# Patient Record
Sex: Female | Born: 1960 | Race: White | Hispanic: No | Marital: Married | Smoking: Never smoker
Health system: Southern US, Community
[De-identification: ages and names within clinical notes are randomized; demographics above are authoritative.]

## PROBLEM LIST (undated history)

## (undated) DIAGNOSIS — K635 Polyp of colon: Secondary | ICD-10-CM

## (undated) DIAGNOSIS — E785 Hyperlipidemia, unspecified: Secondary | ICD-10-CM

## (undated) DIAGNOSIS — K648 Other hemorrhoids: Secondary | ICD-10-CM

## (undated) HISTORY — PX: TONSILLECTOMY: SUR1361

## (undated) HISTORY — DX: Hyperlipidemia, unspecified: E78.5

## (undated) HISTORY — DX: Polyp of colon: K63.5

## (undated) HISTORY — DX: Other hemorrhoids: K64.8

---

## 1998-04-10 ENCOUNTER — Other Ambulatory Visit: Admission: RE | Admit: 1998-04-10 | Discharge: 1998-04-10 | Payer: Self-pay | Admitting: Ophthalmology

## 1998-08-01 ENCOUNTER — Ambulatory Visit (HOSPITAL_COMMUNITY): Admission: RE | Admit: 1998-08-01 | Discharge: 1998-08-01 | Payer: Self-pay

## 1998-08-01 ENCOUNTER — Encounter: Payer: Self-pay | Admitting: Obstetrics and Gynecology

## 1999-04-16 ENCOUNTER — Other Ambulatory Visit: Admission: RE | Admit: 1999-04-16 | Discharge: 1999-04-16 | Payer: Self-pay | Admitting: Obstetrics and Gynecology

## 1999-08-12 ENCOUNTER — Ambulatory Visit (HOSPITAL_COMMUNITY): Admission: RE | Admit: 1999-08-12 | Discharge: 1999-08-12 | Payer: Self-pay | Admitting: Obstetrics and Gynecology

## 1999-08-12 ENCOUNTER — Encounter: Payer: Self-pay | Admitting: Obstetrics and Gynecology

## 2000-06-08 ENCOUNTER — Other Ambulatory Visit: Admission: RE | Admit: 2000-06-08 | Discharge: 2000-06-08 | Payer: Self-pay | Admitting: Obstetrics and Gynecology

## 2000-08-17 ENCOUNTER — Ambulatory Visit (HOSPITAL_COMMUNITY): Admission: RE | Admit: 2000-08-17 | Discharge: 2000-08-17 | Payer: Self-pay | Admitting: Obstetrics and Gynecology

## 2000-08-17 ENCOUNTER — Encounter: Payer: Self-pay | Admitting: Obstetrics and Gynecology

## 2001-07-08 ENCOUNTER — Other Ambulatory Visit: Admission: RE | Admit: 2001-07-08 | Discharge: 2001-07-08 | Payer: Self-pay | Admitting: Obstetrics and Gynecology

## 2001-08-23 ENCOUNTER — Encounter: Payer: Self-pay | Admitting: Obstetrics and Gynecology

## 2001-08-23 ENCOUNTER — Ambulatory Visit (HOSPITAL_COMMUNITY): Admission: RE | Admit: 2001-08-23 | Discharge: 2001-08-23 | Payer: Self-pay | Admitting: Obstetrics and Gynecology

## 2002-07-24 ENCOUNTER — Other Ambulatory Visit: Admission: RE | Admit: 2002-07-24 | Discharge: 2002-07-24 | Payer: Self-pay | Admitting: Obstetrics and Gynecology

## 2002-08-29 ENCOUNTER — Encounter: Payer: Self-pay | Admitting: Obstetrics and Gynecology

## 2002-08-29 ENCOUNTER — Ambulatory Visit (HOSPITAL_COMMUNITY): Admission: RE | Admit: 2002-08-29 | Discharge: 2002-08-29 | Payer: Self-pay | Admitting: Obstetrics and Gynecology

## 2003-07-26 ENCOUNTER — Other Ambulatory Visit: Admission: RE | Admit: 2003-07-26 | Discharge: 2003-07-26 | Payer: Self-pay | Admitting: Obstetrics and Gynecology

## 2003-08-31 ENCOUNTER — Ambulatory Visit (HOSPITAL_COMMUNITY): Admission: RE | Admit: 2003-08-31 | Discharge: 2003-08-31 | Payer: Self-pay | Admitting: Obstetrics and Gynecology

## 2003-11-05 ENCOUNTER — Ambulatory Visit (HOSPITAL_COMMUNITY): Admission: RE | Admit: 2003-11-05 | Discharge: 2003-11-05 | Payer: Self-pay | Admitting: Gastroenterology

## 2004-08-05 ENCOUNTER — Other Ambulatory Visit: Admission: RE | Admit: 2004-08-05 | Discharge: 2004-08-05 | Payer: Self-pay | Admitting: Obstetrics and Gynecology

## 2004-09-03 ENCOUNTER — Ambulatory Visit (HOSPITAL_COMMUNITY): Admission: RE | Admit: 2004-09-03 | Discharge: 2004-09-03 | Payer: Self-pay | Admitting: Obstetrics and Gynecology

## 2005-08-13 ENCOUNTER — Other Ambulatory Visit: Admission: RE | Admit: 2005-08-13 | Discharge: 2005-08-13 | Payer: Self-pay | Admitting: Obstetrics and Gynecology

## 2005-09-11 ENCOUNTER — Ambulatory Visit (HOSPITAL_COMMUNITY): Admission: RE | Admit: 2005-09-11 | Discharge: 2005-09-11 | Payer: Self-pay | Admitting: Obstetrics and Gynecology

## 2006-09-13 ENCOUNTER — Ambulatory Visit (HOSPITAL_COMMUNITY): Admission: RE | Admit: 2006-09-13 | Discharge: 2006-09-13 | Payer: Self-pay | Admitting: Obstetrics and Gynecology

## 2007-09-16 ENCOUNTER — Ambulatory Visit (HOSPITAL_COMMUNITY): Admission: RE | Admit: 2007-09-16 | Discharge: 2007-09-16 | Payer: Self-pay | Admitting: Obstetrics and Gynecology

## 2008-07-24 ENCOUNTER — Ambulatory Visit: Payer: Self-pay | Admitting: Vascular Surgery

## 2008-09-18 ENCOUNTER — Ambulatory Visit (HOSPITAL_COMMUNITY): Admission: RE | Admit: 2008-09-18 | Discharge: 2008-09-18 | Payer: Self-pay | Admitting: Obstetrics and Gynecology

## 2008-10-11 ENCOUNTER — Ambulatory Visit: Payer: Self-pay | Admitting: Vascular Surgery

## 2008-10-18 ENCOUNTER — Ambulatory Visit: Payer: Self-pay | Admitting: Vascular Surgery

## 2009-09-19 ENCOUNTER — Ambulatory Visit (HOSPITAL_COMMUNITY): Admission: RE | Admit: 2009-09-19 | Discharge: 2009-09-19 | Payer: Self-pay | Admitting: Obstetrics and Gynecology

## 2010-09-25 ENCOUNTER — Ambulatory Visit (HOSPITAL_COMMUNITY)
Admission: RE | Admit: 2010-09-25 | Discharge: 2010-09-25 | Payer: Self-pay | Source: Home / Self Care | Attending: Obstetrics and Gynecology | Admitting: Obstetrics and Gynecology

## 2010-11-13 DIAGNOSIS — E782 Mixed hyperlipidemia: Secondary | ICD-10-CM | POA: Insufficient documentation

## 2011-01-27 NOTE — Consult Note (Signed)
VASCULAR SURGERY CONSULTATION   Alexandra Burton, Alexandra Burton A  DOB:  February 26, 1961                                       07/24/2008  UEAVW#:[id number]   The patient is referred for vascular surgery consultation by Dr.  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  This 50-year-  old healthy female has noticed increasingly prominent varicosities in  her left leg both in the pretibial region and laterally over the last  few years.  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  She has had  no history of swelling, bleeding, ulceration, thrombophlebitis, deep  vein thrombosis, or other complications.  She states the discomfort is  present both while lying and standing, and is not necessarily relieved  by elevation or pain medication.  She has not had these previously  evaluated.   PAST MEDICAL HISTORY:  Negative for diabetes, hypertension, coronary  artery disease, COPD, or stroke.  She does have hyperlipidemia, treated  medically.   PREVIOUS SURGERIES:  Includes tonsillectomy.   FAMILY HISTORY:  Positive for varicose veins in her mother, diabetes and  COPD in her father, negative for stroke or coronary artery disease.   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  She does not use tobacco or  alcohol.   REVIEW OF SYSTEMS:  Unremarkable.   ALLERGIES:  None known.   MEDICATIONS:  Please see health history exam.   PHYSICAL EXAMINATION:  Blood pressure 114/67, heart rate is 54,  respirations 12.  Generally, she is a healthy-appearing middle-aged  female in no apparent distress.  She is alert and oriented x3.  Neck is  supple with 3+ carotid pulses palpable.  No bruits are audible.  Neurologic exam is normal.  No palpable adenopathy in the neck.  Chest  is clear to auscultation.  Cardiovascular exam is a regular rate and  rhythm without murmurs.  Abdomen is soft and nontender with no masses.  She has 3+ femoral, popliteal, and dorsalis pedis pulses bilaterally.  There is no distal edema or evidence of hyperpigmentation, ulceration,  or severe venous insufficiency distally in either leg.  The right leg is  free of varicosities.  The left leg has a prominent varix or large  reticular vein in the pretibial region and a string of prominent small  varicosities beginning at the knee level laterally extending down toward  the lateral malleolus, as well as some prominent veins at the medial  malleolus.  There are no thigh varicosities noted.   Venous duplex exam reveals no evidence of deep vein thrombosis and the  deep system is normal.  There is no valvular incompetence in the great  or small saphenous systems.   I discussed these findings with her and have recommended primary  sclerotherapy for these prominent and symptomatic varicosities, and we  will discuss this further with her and schedule her in the near future  if she so desires.   Quita Skye Hart [place], M.D.  Electronically Signed  JDL/MEDQ  D:  07/24/2008  T:  07/25/2008  Job:  [id number]

## 2011-01-27 NOTE — Procedures (Signed)
LOWER EXTREMITY VENOUS REFLUX EXAM   INDICATION:  Left lower extremity varicose vein.   EXAM:  Using color-flow imaging and pulse Doppler spectral analysis, the  left common femoral, superficial femoral, popliteal, posterior tibial,  greater and lesser saphenous veins are evaluated.  There is no evidence  suggesting deep venous insufficiency in the left lower extremity.   The left saphenofemoral junction is competent.  The left GSV is  competent.   The left proximal short saphenous vein demonstrates competency.   GSV Diameter (used if found to be incompetent only)                                            Right    Left  Proximal Greater Saphenous Vein           cm       cm  Proximal-to-mid-thigh                     cm       cm  Mid thigh                                 cm       cm  Mid-distal thigh                          cm       cm  Distal thigh                              cm       cm  Knee                                      cm       cm   IMPRESSION:  1. Left greater saphenous vein shows no evidence of reflux.  2. The left greater saphenous vein is not aneurysmal.  3. The left greater saphenous vein is not tortuous.  4. The deep venous system is competent.  5. The left lesser saphenous vein is competent.  6. A short varicose vein was noted on the anterior left calf which was      unable to have origin visualized.   ___________________________________________  Quita Skye Hart [place], M.D.   AS/MEDQ  D:  07/24/2008  T:  07/24/2008  Job:  [id number]

## 2011-01-30 NOTE — Op Note (Signed)
Alexandra Burton, Alexandra Burton                        ACCOUNT NO.:  [id number]   MEDICAL RECORD NO.:  [id number]                   PATIENT TYPE:  AMB   LOCATION:  ENDO                                 FACILITY:  [facility]   PHYSICIAN:  Anselmo Rod, M.D.               DATE OF BIRTH:  Feb 02, 1961   DATE OF PROCEDURE:  11/05/2003  DATE OF DISCHARGE:                                 OPERATIVE REPORT   PROCEDURE PERFORMED:  Screening colonoscopy.   ENDOSCOPIST:  Anselmo Rod, M.D.   INSTRUMENT USED:  Olympus videocolonoscope.   INDICATION FOR THE PROCEDURE:  A 50 year old with history of guaiac-positive  stool on routine physical, rule out colonic polyps, masses, etc.   PREPROCEDURE PREPARATION:  Informed consent was procured from the patient.  The patient was fasted for eight hours prior to the procedure and prepped  with a bottle of magnesium citrate and a gallon of GoLYTELY the night prior  to the procedure.   PREPROCEDURE PHYSICAL:  VITAL SIGNS:  Stable.  NECK:  Supple.  CHEST:  Clear to auscultation.  S1 and S2 regular.  ABDOMEN:  Soft with normal bowel sounds.   DESCRIPTION OF THE PROCEDURE:  The patient was placed in the left lateral  decubitus position and sedated with 75 mg of Demerol and 7.5 mg of Versed  intravenously.  Once the patient was adequately sedated and maintained on  low-flow oxygen and continuous cardiac monitoring, the Olympus  videocolonoscope was advanced from the rectum to the cecum and terminal  ileum.  The appendiceal orifice and ileocecal valve were clearly visualized  and photographed.  No masses, polyps, erosions, ulcerations, or diverticula  were seen.  Small internal hemorrhoids were seen on retroflexion in the  rectum.  The patient tolerated the procedure well without complications.  The terminal ileum appeared normal and without lesions.   IMPRESSION:  Normal colonoscopy to the terminal ileum except for small  internal hemorrhoids.    RECOMMENDATIONS:  1. Repeat colorectal cancer screening is recommended in the next 10 years     unless the patient develops any     abnormal symptoms in the interim.  2. Outpatient followup in the next two weeks for repeat guaiac testing.     Further recommendations will be made at that time.                                               Anselmo Rod, M.D.    JNM/MEDQ  D:  11/05/2003  T:  11/05/2003  Job:  [id number]   cc:   Talmadge Coventry, M.D.  [address]  [place]  [place] [address]  Fax: [phone]

## 2011-08-31 ENCOUNTER — Other Ambulatory Visit (HOSPITAL_COMMUNITY): Payer: Self-pay | Admitting: Obstetrics and Gynecology

## 2011-08-31 DIAGNOSIS — Z1231 Encounter for screening mammogram for malignant neoplasm of breast: Secondary | ICD-10-CM

## 2011-10-08 ENCOUNTER — Ambulatory Visit (HOSPITAL_COMMUNITY)
Admission: RE | Admit: 2011-10-08 | Discharge: 2011-10-08 | Disposition: A | Discharge status: Home / Self Care | Payer: BC Managed Care – PPO | Source: Ambulatory Visit | Attending: Obstetrics and Gynecology | Admitting: Obstetrics and Gynecology

## 2011-10-08 DIAGNOSIS — Z1231 Encounter for screening mammogram for malignant neoplasm of breast: Secondary | ICD-10-CM

## 2011-10-12 ENCOUNTER — Ambulatory Visit (HOSPITAL_COMMUNITY): Payer: Self-pay

## 2012-01-28 ENCOUNTER — Other Ambulatory Visit: Payer: Self-pay | Admitting: Family Medicine

## 2012-01-28 DIAGNOSIS — N6459 Other signs and symptoms in breast: Secondary | ICD-10-CM

## 2012-02-05 ENCOUNTER — Ambulatory Visit
Admission: RE | Admit: 2012-02-05 | Discharge: 2012-02-05 | Disposition: A | Discharge status: Home / Self Care | Payer: BC Managed Care – PPO | Source: Ambulatory Visit | Attending: Family Medicine | Admitting: Family Medicine

## 2012-02-05 DIAGNOSIS — N6459 Other signs and symptoms in breast: Secondary | ICD-10-CM

## 2012-09-15 ENCOUNTER — Other Ambulatory Visit (HOSPITAL_COMMUNITY): Payer: Self-pay | Admitting: Obstetrics and Gynecology

## 2012-09-15 DIAGNOSIS — Z1231 Encounter for screening mammogram for malignant neoplasm of breast: Secondary | ICD-10-CM

## 2012-10-10 ENCOUNTER — Ambulatory Visit (HOSPITAL_COMMUNITY)
Admission: RE | Admit: 2012-10-10 | Discharge: 2012-10-10 | Disposition: A | Discharge status: Home / Self Care | Payer: BC Managed Care – PPO | Source: Ambulatory Visit | Attending: Obstetrics and Gynecology | Admitting: Obstetrics and Gynecology

## 2012-10-10 DIAGNOSIS — Z1231 Encounter for screening mammogram for malignant neoplasm of breast: Secondary | ICD-10-CM | POA: Insufficient documentation

## 2012-10-12 ENCOUNTER — Other Ambulatory Visit: Payer: Self-pay | Admitting: Obstetrics and Gynecology

## 2012-10-12 DIAGNOSIS — R928 Other abnormal and inconclusive findings on diagnostic imaging of breast: Secondary | ICD-10-CM

## 2012-10-24 ENCOUNTER — Ambulatory Visit
Admission: RE | Admit: 2012-10-24 | Discharge: 2012-10-24 | Disposition: A | Discharge status: Home / Self Care | Payer: BC Managed Care – PPO | Source: Ambulatory Visit | Attending: Obstetrics and Gynecology | Admitting: Obstetrics and Gynecology

## 2012-10-24 DIAGNOSIS — R928 Other abnormal and inconclusive findings on diagnostic imaging of breast: Secondary | ICD-10-CM

## 2013-09-25 ENCOUNTER — Other Ambulatory Visit (HOSPITAL_COMMUNITY): Payer: Self-pay | Admitting: Obstetrics and Gynecology

## 2013-09-25 DIAGNOSIS — Z1231 Encounter for screening mammogram for malignant neoplasm of breast: Secondary | ICD-10-CM

## 2013-10-23 ENCOUNTER — Ambulatory Visit (HOSPITAL_COMMUNITY)
Admission: RE | Admit: 2013-10-23 | Discharge: 2013-10-23 | Disposition: A | Discharge status: Home / Self Care | Payer: BC Managed Care – PPO | Source: Ambulatory Visit | Attending: Obstetrics and Gynecology | Admitting: Obstetrics and Gynecology

## 2013-10-23 DIAGNOSIS — Z1231 Encounter for screening mammogram for malignant neoplasm of breast: Secondary | ICD-10-CM

## 2013-12-20 DIAGNOSIS — N952 Postmenopausal atrophic vaginitis: Secondary | ICD-10-CM | POA: Insufficient documentation

## 2013-12-20 DIAGNOSIS — N951 Menopausal and female climacteric states: Secondary | ICD-10-CM | POA: Insufficient documentation

## 2014-01-27 DIAGNOSIS — Z8379 Family history of other diseases of the digestive system: Secondary | ICD-10-CM | POA: Insufficient documentation

## 2014-04-20 DIAGNOSIS — K635 Polyp of colon: Secondary | ICD-10-CM

## 2014-04-20 HISTORY — DX: Polyp of colon: K63.5

## 2014-04-20 LAB — HM COLONOSCOPY

## 2014-09-21 ENCOUNTER — Other Ambulatory Visit (HOSPITAL_COMMUNITY): Payer: Self-pay | Admitting: Family Medicine

## 2014-09-21 DIAGNOSIS — Z1231 Encounter for screening mammogram for malignant neoplasm of breast: Secondary | ICD-10-CM

## 2014-11-15 ENCOUNTER — Ambulatory Visit (HOSPITAL_COMMUNITY)
Admission: RE | Admit: 2014-11-15 | Discharge: 2014-11-15 | Disposition: A | Discharge status: Home / Self Care | Payer: BC Managed Care – PPO | Source: Ambulatory Visit | Attending: Family Medicine | Admitting: Family Medicine

## 2014-11-15 DIAGNOSIS — Z1231 Encounter for screening mammogram for malignant neoplasm of breast: Secondary | ICD-10-CM | POA: Diagnosis present

## 2015-10-18 ENCOUNTER — Other Ambulatory Visit: Payer: Self-pay

## 2015-10-18 DIAGNOSIS — Z1231 Encounter for screening mammogram for malignant neoplasm of breast: Secondary | ICD-10-CM

## 2015-11-21 ENCOUNTER — Ambulatory Visit
Admission: RE | Admit: 2015-11-21 | Discharge: 2015-11-21 | Disposition: A | Discharge status: Home / Self Care | Payer: BC Managed Care – PPO | Source: Ambulatory Visit

## 2015-11-21 DIAGNOSIS — Z1231 Encounter for screening mammogram for malignant neoplasm of breast: Secondary | ICD-10-CM

## 2016-10-22 ENCOUNTER — Other Ambulatory Visit: Payer: Self-pay | Admitting: Family Medicine

## 2016-10-22 DIAGNOSIS — Z1231 Encounter for screening mammogram for malignant neoplasm of breast: Secondary | ICD-10-CM

## 2016-11-23 ENCOUNTER — Ambulatory Visit: Payer: BC Managed Care – PPO

## 2016-12-14 ENCOUNTER — Ambulatory Visit
Admission: RE | Admit: 2016-12-14 | Discharge: 2016-12-14 | Disposition: A | Discharge status: Home / Self Care | Payer: BC Managed Care – PPO | Source: Ambulatory Visit | Attending: Family Medicine | Admitting: Family Medicine

## 2016-12-14 DIAGNOSIS — Z1231 Encounter for screening mammogram for malignant neoplasm of breast: Secondary | ICD-10-CM

## 2016-12-15 ENCOUNTER — Ambulatory Visit: Payer: BC Managed Care – PPO

## 2016-12-17 LAB — HM MAMMOGRAPHY

## 2016-12-31 LAB — BASIC METABOLIC PANEL: Creatinine: 0.9 (ref <=1.1)

## 2016-12-31 LAB — LIPID PANEL
CHOLESTEROL: 219 — AB (ref 0–200)
HDL: 51 (ref 35–70)
LDL CALC: 146
Triglycerides: 112 (ref 40–160)

## 2016-12-31 LAB — HM PAP SMEAR: HM Pap smear: NEGATIVE

## 2017-11-25 ENCOUNTER — Other Ambulatory Visit: Payer: Self-pay | Admitting: Family Medicine

## 2017-11-25 DIAGNOSIS — Z139 Encounter for screening, unspecified: Secondary | ICD-10-CM

## 2017-12-15 ENCOUNTER — Ambulatory Visit
Admission: RE | Admit: 2017-12-15 | Discharge: 2017-12-15 | Disposition: A | Discharge status: Home / Self Care | Payer: BC Managed Care – PPO | Source: Ambulatory Visit | Attending: Family Medicine | Admitting: Family Medicine

## 2017-12-15 DIAGNOSIS — Z139 Encounter for screening, unspecified: Secondary | ICD-10-CM

## 2017-12-30 ENCOUNTER — Encounter: Payer: Self-pay | Admitting: Family Medicine

## 2018-01-03 ENCOUNTER — Ambulatory Visit: Payer: BC Managed Care – PPO | Admitting: Family Medicine

## 2018-01-03 ENCOUNTER — Other Ambulatory Visit: Payer: Self-pay

## 2018-01-03 ENCOUNTER — Encounter: Payer: Self-pay | Admitting: Family Medicine

## 2018-01-03 VITALS — BP 104/70 | HR 52 | Temp 97.7°F | Ht 66.0 in | Wt 170.6 lb

## 2018-01-03 DIAGNOSIS — Z0000 Encounter for general adult medical examination without abnormal findings: Secondary | ICD-10-CM

## 2018-01-03 DIAGNOSIS — Z0184 Encounter for antibody response examination: Secondary | ICD-10-CM | POA: Diagnosis not present

## 2018-01-03 DIAGNOSIS — E782 Mixed hyperlipidemia: Secondary | ICD-10-CM

## 2018-01-03 DIAGNOSIS — N951 Menopausal and female climacteric states: Secondary | ICD-10-CM | POA: Diagnosis not present

## 2018-01-03 LAB — LIPID PANEL
CHOLESTEROL: 199 mg/dL (ref 0–200)
HDL: 63.6 mg/dL (ref >39.00)
LDL CALC: 122 mg/dL — AB (ref 0–99)
NonHDL: 135.68
Total CHOL/HDL Ratio: 3
Triglycerides: 68 mg/dL (ref 0.0–149.0)
VLDL: 13.6 mg/dL (ref 0.0–40.0)

## 2018-01-03 LAB — COMPREHENSIVE METABOLIC PANEL
ALBUMIN: 4.3 g/dL (ref 3.5–5.2)
ALK PHOS: 65 U/L (ref 39–117)
ALT: 13 U/L (ref 0–35)
AST: 20 U/L (ref 0–37)
BUN: 12 mg/dL (ref 6–23)
CALCIUM: 9.3 mg/dL (ref 8.4–10.5)
CHLORIDE: 104 meq/L (ref 96–112)
CO2: 26 mEq/L (ref 19–32)
CREATININE: 0.71 mg/dL (ref 0.40–1.20)
GFR: 90.26 mL/min (ref >60.00)
Glucose, Bld: 87 mg/dL (ref 70–99)
Potassium: 4.1 mEq/L (ref 3.5–5.1)
Sodium: 140 mEq/L (ref 135–145)
Total Bilirubin: 0.5 mg/dL (ref 0.2–1.2)
Total Protein: 6.8 g/dL (ref 6.0–8.3)

## 2018-01-03 LAB — CBC WITH DIFFERENTIAL/PLATELET
BASOS PCT: 0.9 % (ref 0.0–3.0)
Basophils Absolute: 0 10*3/uL (ref 0.0–0.1)
EOS ABS: 0.2 10*3/uL (ref 0.0–0.7)
Eosinophils Relative: 3.1 % (ref 0.0–5.0)
HEMATOCRIT: 39.8 % (ref 36.0–46.0)
HEMOGLOBIN: 13.4 g/dL (ref 12.0–15.0)
Lymphocytes Relative: 37 % (ref 12.0–46.0)
Lymphs Abs: 1.9 10*3/uL (ref 0.7–4.0)
MCHC: 33.6 g/dL (ref 30.0–36.0)
MCV: 88.1 fl (ref 78.0–100.0)
Monocytes Absolute: 0.5 10*3/uL (ref 0.1–1.0)
Monocytes Relative: 10 % (ref 3.0–12.0)
Neutro Abs: 2.5 10*3/uL (ref 1.4–7.7)
Neutrophils Relative %: 49 % (ref 43.0–77.0)
Platelets: 241 10*3/uL (ref 150.0–400.0)
RBC: 4.52 Mil/uL (ref 3.87–5.11)
RDW: 13.4 % (ref 11.5–15.5)
WBC: 5.1 10*3/uL (ref 4.0–10.5)

## 2018-01-03 MED ORDER — FLUOXETINE HCL 20 MG PO CAPS: 20.0000 mg | ORAL_CAPSULE | Freq: Every day | ORAL | 3 refills | Status: DC

## 2018-01-03 MED ORDER — LOVASTATIN 20 MG PO TABS: 20.0000 mg | ORAL_TABLET | Freq: Every day | ORAL | 3 refills | Status: DC

## 2018-01-03 NOTE — Progress Notes (Signed)
Subjective  Chief Complaint  Patient presents with  . Establish Care    Transfer from [place]  . Medication Refill    Lovastation & Prozac    HPI: Alexandra Burton is a 57 y.o. female who presents to [facility] [facility] today for a Female Wellness Visit. She is a former [facility] patient and is here to reestablish care with me today.    Wellness Visit: annual visit with health maintenance review and exam without Pap   Healthy 57 yo female w/o complaints. Due for f/u hyperlipidemia on statin (although borderline ascvd risk score in past - started by prior pcp and pt favors continuing it). No AEs. On prozac for menopausal sxs doing well. Still an occ hot flush. No problems.   Pt is a 1st grade teacher: would consider MMR booster if indicated.   Lifestyle: Body mass index is 27.54 kg/m. Wt Readings from Last 3 Encounters:  01/03/18 170 lb 9.6 oz (77.4 kg)   Diet: low fat Exercise: intermittently, walking  Patient Active Problem List   Diagnosis Date Noted  . Family history of liver disease February 15, 2014    Overview:  Mom died from non-alcoholic cirrhosis   . 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 Atrophic vaginitis 12/20/2013  . Mixed hyperlipidemia 11/13/2010   Health Maintenance  Topic Date Due  . Hepatitis C Screening  1961-06-15  . HIV Screening  04/03/1976  . INFLUENZA VACCINE  04/14/2018  . MAMMOGRAM  12/16/2018  . TETANUS/TDAP  12/23/2021  . PAP SMEAR  12/31/2021  . COLONOSCOPY  04/20/2024   Immunization History  Administered Date(s) Administered  . Influenza, Seasonal, Injecte, Preservative Fre 07/16/2015, 08/29/2016  . MMR 09/15/1987  . PPD Test 09/15/1987  . Td 06/06/2002, 09/14/2009  . Tdap 09/14/2009, 12/24/2011   We updated and reviewed the patient's past history in detail and it is documented below. Allergies: Patient has No Known Allergies. Past Medical History Patient  has a past  medical history of Colon polyp (04/20/2014), Hyperlipemia, and Internal hemorrhoids without complication. Past Surgical History Patient  has a past surgical history that includes Tonsillectomy. Family History: Patient family history includes Brain cancer in her maternal grandfather; Breast cancer in her sister; COPD in her father; Cirrhosis in her mother; Diabetes in her father; Heart disease in her maternal grandfather; Hyperlipidemia in her father. Social History:  Patient  reports that she has never smoked. She has never used smokeless tobacco. She reports that she drank alcohol. She reports that she does not use drugs.  Review of Systems: Constitutional: negative for fever or malaise Ophthalmic: negative for photophobia, double vision or loss of vision Cardiovascular: negative for chest pain, dyspnea on exertion, or new LE swelling Respiratory: negative for SOB or persistent cough Gastrointestinal: negative for abdominal pain, change in bowel habits or melena Genitourinary: negative for dysuria or gross hematuria, no abnormal uterine bleeding or disharge Musculoskeletal: negative for new gait disturbance or muscular weakness Integumentary: negative for new or persistent rashes, no breast lumps Neurological: negative for TIA or stroke symptoms Psychiatric: negative for SI or delusions Allergic/Immunologic: negative for hives  Patient Care Team    Relationship Specialty Notifications Start End  Leamon Arnt, MD PCP - General Family Medicine  12/14/16   Christain Sacramento, [place] Referring Physician Optometry  01/03/18   Jerelene Redden, DDS     01/03/18     Objective  Vitals: BP 104/70   Pulse (!) 52  Temp 97.7 F (36.5 C)   Ht _0  (1.676 m)   Wt 170 lb 9.6 oz (77.4 kg)   LMP 09/27/2012   BMI 27.54 kg/m  General:  Well developed, well nourished, no acute distress  Psych:  Alert and orientedx3,normal mood and affect HEENT:  Normocephalic, atraumatic, non-icteric sclera, PERRL,  oropharynx is clear without mass or exudate, supple neck without adenopathy, mass or thyromegaly Cardiovascular:  Normal S1, S2, RRR without gallop, rub or murmur, nondisplaced PMI Respiratory:  Good breath sounds bilaterally, CTAB with normal respiratory effort Gastrointestinal: normal bowel sounds, soft, non-tender, no noted masses. No HSM MSK: no deformities, contusions. Joints are without erythema or swelling. Spine and CVA region are nontender Skin:  Warm, no rashes or suspicious lesions noted Neurologic:    Mental status is normal. CN 2-11 are normal. Gross motor and sensory exams are normal. Normal gait. No tremor Breast Exam: No mass, skin retraction or nipple discharge is appreciated in either breast. No axillary adenopathy. Fibrocystic changes are not noted  Assessment  1. Annual physical exam   2. Mixed hyperlipidemia   3. Menopausal vasomotor syndrome   4. Immunity to measles determined by serologic test      Plan  Female Wellness Visit:  Age appropriate Health Maintenance and Prevention measures were discussed with patient. Included topics are cancer screening recommendations, ways to keep healthy (see AVS) including dietary and exercise recommendations, regular eye and dental care, use of seat belts, and avoidance of moderate alcohol use and tobacco use. Screens are up to date.   BMI: discussed patient's BMI and encouraged positive lifestyle modifications to help get to or maintain a target BMI.  HM needs and immunizations were addressed and ordered. See below for orders. See HM and immunization section for updates. Check Measles immunity and recommend booster if low due to reemergence of measles and her work exposure.   Routine labs and screening tests ordered including cmp, cbc and lipids where appropriate.  Discussed recommendations regarding Vit D and calcium supplementation (see AVS)  Recheck lipids; refilled statin and prozac. Stable.   Follow up: 12 months for cpe     Commons side effects, risks, benefits, and alternatives for medications and treatment plan prescribed today were discussed, and the patient expressed understanding of the given instructions. Patient is instructed to call or message via MyChart if he/she has any questions or concerns regarding our treatment plan. No barriers to understanding were identified. We discussed Red Flag symptoms and signs in detail. Patient expressed understanding regarding what to do in case of urgent or emergency type symptoms.   Medication list was reconciled, printed and provided to the patient in AVS. Patient instructions and summary information was reviewed with the patient as documented in the AVS. This note was prepared with assistance of Dragon voice recognition software. Occasional wrong-word or sound-a-like substitutions may have occurred due to the inherent limitations of voice recognition software  Orders Placed This Encounter  Procedures  . HM MAMMOGRAPHY  . HM PAP SMEAR  . Basic metabolic panel  . Lipid panel  . CBC with Differential/Platelet  . Comprehensive metabolic panel  . Lipid panel  . HIV antibody  . Hepatitis C antibody  . Rubeola antibody IgG  . HM COLONOSCOPY   Meds ordered this encounter  Medications  . FLUoxetine (PROZAC) 20 MG capsule    Sig: Take 1 capsule (20 mg total) by mouth daily.    Dispense:  90 capsule    Refill:  3  .  lovastatin (MEVACOR) 20 MG tablet    Sig: Take 1 tablet (20 mg total) by mouth at bedtime.    Dispense:  90 tablet    Refill:  3

## 2018-01-03 NOTE — Patient Instructions (Addendum)
It was so good seeing you again! Thank you for establishing with my new practice and allowing me to continue caring for you. It means a lot to me.   Please schedule a follow up appointment with me in 12 months for your annual complete physical; please come fasting.  Please do these things to maintain good health!   Exercise at least 30-45 minutes a day,  4-5 days a week.   Eat a low-fat diet with lots of fruits and vegetables, up to 7-9 servings per day.  Drink plenty of water daily. Try to drink 8 8oz glasses per day.  Seatbelts can save your life. Always wear your seatbelt.  Place Smoke Detectors on every level of your home and check batteries every year.  Schedule an appointment with an eye doctor for an eye exam every 1-2 years  Safe sex - use condoms to protect yourself from STDs if you could be exposed to these types of infections. Use birth control if you do not want to become pregnant and are sexually active.  Avoid heavy alcohol use. If you drink, keep it to less than 2 drinks/day and not every day.  [place].  Choose someone you trust that could speak for you if you became unable to speak for yourself.  Depression is common in our stressful world.If you're feeling down or losing interest in things you normally enjoy, please come in for a visit.  If anyone is threatening or hurting you, please get help. Physical or Emotional Violence is never OK.     Calcium Intake Recommendations You can take Caltrate Plus twice a day or get it through your diet or other OTC supplements (Viactiv, OsCal etc)  Calcium is a mineral that affects many functions in the body, including:  Blood clotting.  Blood vessel function.  Nerve impulse conduction.  Hormone secretion.  Muscle contraction.  Bone and teeth functions.  Most of your body's calcium supply is stored in your bones and teeth. When your calcium stores are low, you may be at risk for low bone mass,  bone loss, and bone fractures. Consuming enough calcium helps to grow healthy bones and teeth and to prevent breakdown over time. It is very important that you get enough calcium if you are:  A child undergoing rapid growth.  An adolescent girl.  A pre- or post-menopausal woman.  A woman whose menstrual cycle has stopped due to anorexia nervosa or regular intense exercise.  An individual with lactose intolerance or a milk allergy.  A vegetarian.  What is my plan? Try to consume the recommended amount of calcium daily based on your age. Depending on your overall health, your health care provider may recommend increased calcium intake.General daily calcium intake recommendations by age are:  Birth to 6 months: 200 mg.  Infants 7 to 12 months: 260 mg.  Children 1 to 3 years: 700 mg.  Children 4 to 8 years: 1,000 mg.  Children 9 to 13 years: 1,300 mg.  Teens 14 to 18 years: 1,300 mg.  Adults 19 to 50 years: 1,000 mg.  Adult women 51 to 70 years: 1,200 mg.  Adult men 51 to 70 years: 1,000 mg.  Adults 71 years and older: 1,200 mg.  Pregnant and breastfeeding teens: 1,300 mg.  Pregnant and breastfeeding adults: 1,000 mg.  What do I need to know about calcium intake?  In order for the body to absorb calcium, it needs vitamin D. You can get vitamin D  through (we recommend getting [phone] units of Vitamin D daily) ? Direct exposure of the skin to sunlight. ? Foods, such as egg yolks, liver, saltwater fish, and fortified milk. ? Supplements.  Consuming too much calcium may cause: ? Constipation. ? Decreased absorption of iron and zinc. ? Kidney stones.  Calcium supplements may interact with certain medicines. Check with your health care provider before starting any calcium supplements.  Try to get most of your calcium from food. What foods can I eat? Grains  Fortified oatmeal. Fortified ready-to-eat cereals. Fortified frozen waffles. Vegetables Turnip greens.  Broccoli. Fruits Fortified orange juice. Meats and Other Protein Sources Canned sardines with bones. Canned salmon with bones. Soy beans. Tofu. Baked beans. Almonds. [place] nuts. Sunflower seeds. Dairy Milk. Yogurt. Cheese. Cottage cheese. Beverages Fortified soy milk. Fortified rice milk. Sweets/Desserts Pudding. Ice Cream. Milkshakes. Blackstrap molasses. The items listed above may not be a complete list of recommended foods or beverages. Contact your dietitian for more options. What foods can affect my calcium intake? It may be more difficult for your body to use calcium or calcium may leave your body more quickly if you consume large amounts of:  Sodium.  Protein.  Caffeine.  Alcohol.  This information is not intended to replace advice given to you by your health care provider. Make sure you discuss any questions you have with your health care provider. Document Released: 04/14/2004 Document Revised: 03/20/2016 Document Reviewed: 02/06/2014 Elsevier Interactive Patient Education  2018 [employer].

## 2018-01-04 LAB — HIV ANTIBODY (ROUTINE TESTING W REFLEX): HIV 1&2 Ab, 4th Generation: NONREACTIVE

## 2018-01-04 LAB — RUBEOLA ANTIBODY IGG: RUBEOLA IGG: 102 [AU]/ml

## 2018-01-05 LAB — HEPATITIS C ANTIBODY
Hepatitis C Ab: NONREACTIVE
SIGNAL TO CUT-OFF: 0.01 (ref <1.00)

## 2018-11-22 ENCOUNTER — Other Ambulatory Visit: Payer: Self-pay | Admitting: Family Medicine

## 2018-11-22 DIAGNOSIS — Z1231 Encounter for screening mammogram for malignant neoplasm of breast: Secondary | ICD-10-CM

## 2019-01-04 ENCOUNTER — Other Ambulatory Visit: Payer: Self-pay | Admitting: Family Medicine

## 2019-01-04 MED ORDER — FLUOXETINE HCL 20 MG PO CAPS: 20.0000 mg | ORAL_CAPSULE | Freq: Every day | ORAL | 0 refills | Status: DC

## 2019-01-04 NOTE — Telephone Encounter (Signed)
See note

## 2019-01-04 NOTE — Telephone Encounter (Signed)
Pt is >3 months overdue pt given #7 tablet refill to last until appt Requested Prescriptions  Pending Prescriptions Disp Refills  . FLUoxetine (PROZAC) 20 MG capsule 7 capsule 0    Sig: Take 1 capsule (20 mg total) by mouth daily.     Psychiatry:  Antidepressants - SSRI Failed - 01/04/2019  1:00 PM      Failed - Valid encounter within last 6 months    Recent Outpatient Visits          1 year ago Annual physical exam   [facility], MD      Future Appointments            In 1 week Leamon Arnt, MD [place], [place]

## 2019-01-04 NOTE — Telephone Encounter (Signed)
Copied from [place] [phone]. Topic: Quick Communication - Rx Refill/Question >> Jan 04, 2019 12:59 PM Ahmed Prima L wrote: Medication: FLUoxetine (PROZAC) 20 MG capsule  Has the patient contacted their pharmacy? Yes- they keep sending to [facility]  (Agent: If no, request that the patient contact the pharmacy for the refill.) (Agent: If yes, when and what did the pharmacy advise?)  Preferred Pharmacy (with phone number or street name): [facility], [place] [address] Phone: [phone] Fax: [phone]   Agent: Please be advised that RX refills may take up to 3 business days. We ask that you follow-up with your pharmacy.

## 2019-01-05 ENCOUNTER — Other Ambulatory Visit: Payer: Self-pay | Admitting: Family Medicine

## 2019-01-05 DIAGNOSIS — Z1231 Encounter for screening mammogram for malignant neoplasm of breast: Secondary | ICD-10-CM

## 2019-01-07 ENCOUNTER — Other Ambulatory Visit: Payer: Self-pay | Admitting: Family Medicine

## 2019-01-11 ENCOUNTER — Encounter: Payer: Self-pay | Admitting: Family Medicine

## 2019-01-11 ENCOUNTER — Other Ambulatory Visit: Payer: Self-pay

## 2019-01-11 ENCOUNTER — Ambulatory Visit (INDEPENDENT_AMBULATORY_CARE_PROVIDER_SITE_OTHER): Payer: BC Managed Care – PPO | Admitting: Family Medicine

## 2019-01-11 VITALS — Resp 16 | Wt 162.8 lb

## 2019-01-11 DIAGNOSIS — N951 Menopausal and female climacteric states: Secondary | ICD-10-CM

## 2019-01-11 DIAGNOSIS — E782 Mixed hyperlipidemia: Secondary | ICD-10-CM | POA: Diagnosis not present

## 2019-01-11 MED ORDER — LOVASTATIN 20 MG PO TABS: 20.0000 mg | ORAL_TABLET | Freq: Every day | ORAL | 3 refills | Status: DC

## 2019-01-11 MED ORDER — FLUOXETINE HCL 20 MG PO CAPS: 20.0000 mg | ORAL_CAPSULE | Freq: Every day | ORAL | 3 refills | Status: DC

## 2019-01-11 NOTE — Assessment & Plan Note (Signed)
Mild hyperlipidemia, mixed: Well-controlled on statin.  Will return in July for fasting lipid panel and LFTs.  Refilled medications at current dose

## 2019-01-11 NOTE — Progress Notes (Signed)
Virtual Visit via Video Note  Subjective  CC:  Chief Complaint  Patient presents with  . Hyperlipidemia  . Menopausal vasomotor syndrome     I connected with [place] on 01/11/19 at  8:00 AM EDT by a video enabled telemedicine application and verified that I am speaking with the correct person using two identifiers. Location patient: Home Location provider: [employer], Office Persons participating in the virtual visit: [place], Leamon Arnt, MD Lilli Light, [place] discussed the limitations of evaluation and management by telemedicine and the availability of in person appointments. The patient expressed understanding and agreed to proceed. HPI: Alexandra Burton is a 58 y.o. female who was contacted today to address the problems listed above in the chief complaint. . 58 year old female who takes Prozac mainly for hot flashes.  She admits that also helps with decreasing worry.  She does have history of mood disorder.  Doing very well and needs refills.  Has rare hot flash. . Requesting refill for hyperlipidemia.  Last checked a year ago.  Has been stable on her statin.  Had mildly elevated cholesterol in the past.  No myalgias.  No concerns.  She was a healthy lifestyle with healthy diet and regular exercise. Marland Kitchen Health maintenance complete physical postponed till July. Assessment  1. Mixed hyperlipidemia   2. Menopausal vasomotor syndrome      Plan  See below for problem based assessment and plan documentation  I discussed the assessment and treatment plan with the patient. The patient was provided an opportunity to ask questions and all were answered. The patient agreed with the plan and demonstrated an understanding of the instructions.   The patient was advised to call back or seek an in-person evaluation if the symptoms worsen or if the condition fails to improve as anticipated. Follow up: Return for complete physical, as scheduled.   04/11/2019  Meds ordered this encounter  Medications  . lovastatin (MEVACOR) 20 MG tablet    Sig: Take 1 tablet (20 mg total) by mouth at bedtime.    Dispense:  90 tablet    Refill:  3  . FLUoxetine (PROZAC) 20 MG capsule    Sig: Take 1 capsule (20 mg total) by mouth daily.    Dispense:  90 capsule    Refill:  3      I reviewed the patients updated PMH, FH, and SocHx.    Patient Active Problem List   Diagnosis Date Noted  . Family history of liver disease 01/27/2014  . 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 Atrophic vaginitis 12/20/2013  . Mixed hyperlipidemia 11/13/2010   Current Meds  Medication Sig  . FLUoxetine (PROZAC) 20 MG capsule Take 1 capsule (20 mg total) by mouth daily.  Marland Kitchen lovastatin (MEVACOR) 20 MG tablet Take 1 tablet (20 mg total) by mouth at bedtime.  . [DISCONTINUED] FLUoxetine (PROZAC) 20 MG capsule Take 1 capsule (20 mg total) by mouth daily.  . [DISCONTINUED] lovastatin (MEVACOR) 20 MG tablet Take 1 tablet (20 mg total) by mouth at bedtime.    Allergies: Patient has No Known Allergies. Family History: Patient family history includes Brain cancer in her maternal grandfather; Breast cancer in her sister; COPD in her father; Cirrhosis in her mother; Diabetes in her father; Heart disease in her maternal grandfather; Hyperlipidemia in her father. Social History:  Patient  reports that she has never smoked. She has never used smokeless tobacco. She reports previous alcohol use. She  reports that she does not use drugs.  Review of Systems: Constitutional: Negative for fever malaise or anorexia Cardiovascular: negative for chest pain Respiratory: negative for SOB or persistent cough Gastrointestinal: negative for abdominal pain  OBJECTIVE Vitals: Resp 16   Wt 162 lb 12.8 oz (73.8 kg)   LMP 09/27/2012   BMI 26.28 kg/m  General: no acute distress , A&Ox3  Leamon Arnt, MD

## 2019-01-11 NOTE — Assessment & Plan Note (Signed)
Very stable on Prozac.  We will continue.  Will consider slow wean in the future.

## 2019-01-11 NOTE — Patient Instructions (Signed)
Please follow up as scheduled for your next visit with me: 04/11/2019 for complete physical  If you have any questions or concerns, please don't hesitate to send me a message via MyChart or call the office at [phone]. Thank you for visiting with [place] today! It's our pleasure caring for you.

## 2019-03-21 ENCOUNTER — Ambulatory Visit
Admission: RE | Admit: 2019-03-21 | Discharge: 2019-03-21 | Disposition: A | Discharge status: Home / Self Care | Payer: BC Managed Care – PPO | Source: Ambulatory Visit | Attending: Family Medicine | Admitting: Family Medicine

## 2019-03-21 ENCOUNTER — Other Ambulatory Visit: Payer: Self-pay

## 2019-03-21 DIAGNOSIS — Z1231 Encounter for screening mammogram for malignant neoplasm of breast: Secondary | ICD-10-CM

## 2019-04-11 ENCOUNTER — Other Ambulatory Visit: Payer: Self-pay

## 2019-04-11 ENCOUNTER — Encounter: Payer: Self-pay | Admitting: Family Medicine

## 2019-04-11 ENCOUNTER — Ambulatory Visit (INDEPENDENT_AMBULATORY_CARE_PROVIDER_SITE_OTHER): Payer: BC Managed Care – PPO | Admitting: Family Medicine

## 2019-04-11 VITALS — BP 116/78 | HR 59 | Temp 98.1°F | Resp 16 | Ht 66.0 in | Wt 163.4 lb

## 2019-04-11 DIAGNOSIS — N951 Menopausal and female climacteric states: Secondary | ICD-10-CM | POA: Diagnosis not present

## 2019-04-11 DIAGNOSIS — E782 Mixed hyperlipidemia: Secondary | ICD-10-CM

## 2019-04-11 DIAGNOSIS — Z0001 Encounter for general adult medical examination with abnormal findings: Secondary | ICD-10-CM

## 2019-04-11 DIAGNOSIS — Z8379 Family history of other diseases of the digestive system: Secondary | ICD-10-CM

## 2019-04-11 DIAGNOSIS — R55 Syncope and collapse: Secondary | ICD-10-CM | POA: Diagnosis not present

## 2019-04-11 DIAGNOSIS — Z0000 Encounter for general adult medical examination without abnormal findings: Secondary | ICD-10-CM

## 2019-04-11 LAB — CBC WITH DIFFERENTIAL/PLATELET
Basophils Absolute: 0 10*3/uL (ref 0.0–0.1)
Basophils Relative: 0.6 % (ref 0.0–3.0)
Eosinophils Absolute: 0.1 10*3/uL (ref 0.0–0.7)
Eosinophils Relative: 2.1 % (ref 0.0–5.0)
HCT: 42.9 % (ref 36.0–46.0)
Hemoglobin: 14 g/dL (ref 12.0–15.0)
Lymphocytes Relative: 33.4 % (ref 12.0–46.0)
Lymphs Abs: 1.8 10*3/uL (ref 0.7–4.0)
MCHC: 32.8 g/dL (ref 30.0–36.0)
MCV: 89.8 fl (ref 78.0–100.0)
Monocytes Absolute: 0.6 10*3/uL (ref 0.1–1.0)
Monocytes Relative: 10.1 % (ref 3.0–12.0)
Neutro Abs: 3 10*3/uL (ref 1.4–7.7)
Neutrophils Relative %: 53.8 % (ref 43.0–77.0)
Platelets: 239 10*3/uL (ref 150.0–400.0)
RBC: 4.77 Mil/uL (ref 3.87–5.11)
RDW: 13.2 % (ref 11.5–15.5)
WBC: 5.5 10*3/uL (ref 4.0–10.5)

## 2019-04-11 LAB — COMPREHENSIVE METABOLIC PANEL
ALT: 10 U/L (ref 0–35)
AST: 17 U/L (ref 0–37)
Albumin: 4.4 g/dL (ref 3.5–5.2)
Alkaline Phosphatase: 70 U/L (ref 39–117)
BUN: 10 mg/dL (ref 6–23)
CO2: 30 mEq/L (ref 19–32)
Calcium: 9.6 mg/dL (ref 8.4–10.5)
Chloride: 102 mEq/L (ref 96–112)
Creatinine, Ser: 0.84 mg/dL (ref 0.40–1.20)
GFR: 69.63 mL/min (ref >60.00)
Glucose, Bld: 87 mg/dL (ref 70–99)
Potassium: 5.3 mEq/L — ABNORMAL HIGH (ref 3.5–5.1)
Sodium: 137 mEq/L (ref 135–145)
Total Bilirubin: 0.5 mg/dL (ref 0.2–1.2)
Total Protein: 6.9 g/dL (ref 6.0–8.3)

## 2019-04-11 LAB — LIPID PANEL
Cholesterol: 208 mg/dL — ABNORMAL HIGH (ref 0–200)
HDL: 49.7 mg/dL (ref >39.00)
NonHDL: 158.29
Total CHOL/HDL Ratio: 4
Triglycerides: 209 mg/dL — ABNORMAL HIGH (ref 0.0–149.0)
VLDL: 41.8 mg/dL — ABNORMAL HIGH (ref 0.0–40.0)

## 2019-04-11 LAB — TSH: TSH: 1.98 u[IU]/mL (ref 0.35–4.50)

## 2019-04-11 LAB — LDL CHOLESTEROL, DIRECT: Direct LDL: 119 mg/dL

## 2019-04-11 NOTE — Progress Notes (Signed)
Subjective  Chief Complaint  Patient presents with  . Annual Exam    She is fasting  . Hyperlipidemia    HPI: Alexandra Burton is a 58 y.o. female who presents to [place] at [facility] today for a Female Wellness Visit. She also has the concerns and/or needs as listed above in the chief complaint. These will be addressed in addition to the Health Maintenance Visit.   Wellness Visit: annual visit with health maintenance review and exam without Pap   HM: Mammogram and Pap smear screening up-to-date.  Continues healthy lifestyle.  Walks 5 miles 2-3 times per week.  Does Pilates.  Eats healthy.  Weight is stable. Chronic disease f/u and/or acute problem visit: (deemed necessary to be done in addition to the wellness visit):  Menopausal symptoms are stable on Prozac.  Hyperlipidemia on statin without adverse effects.  Compliance is good with medications.  No myalgias.  Due for recheck with LFTs.  There is a family history of nonalcoholic cirrhosis in her mother.  Patient denies right upper quadrant pain, jaundice or edema.  Syncopal episode patient reports 2 days ago she was working out in a recumbent position doing Pilates and she went to stand up she felt immediate lightheadedness and awoke on the floor.  She has bruises on her back from the fall.  She does not remember hitting the floor.  She had no other presyncopal symptoms, no diaphoresis, palpitations, chest pain, shortness of breath.  She does report that 2-3 times per week she will have to be careful special for lightheaded upon standing.  She denies vertigo, paresis, headaches.  Assessment  1. Annual physical exam   2. Mixed hyperlipidemia   3. Family history of liver disease   4. Syncope, unspecified syncope type   5. Menopausal vasomotor syndrome      Plan  Female Wellness Visit:  Age appropriate Health Maintenance and Prevention measures were discussed with patient. Included topics are cancer screening  recommendations, ways to keep healthy (see AVS) including dietary and exercise recommendations, regular eye and dental care, use of seat belts, and avoidance of moderate alcohol use and tobacco use.   BMI: discussed patient's BMI and encouraged positive lifestyle modifications to help get to or maintain a target BMI.  HM needs and immunizations were addressed and ordered. See below for orders. See HM and immunization section for updates.  Routine labs and screening tests ordered including cmp, cbc and lipids where appropriate.  Discussed recommendations regarding Vit D and calcium supplementation (see AVS)  Chronic disease management visit and/or acute problem visit:  Hyperlipidemia: Recheck lipids today on statin.  Check LFTs  Menopausal vasomotor symptoms are well controlled on Prozac.  Syncopal episode is intermittent lightheadedness: EKG shows mild bradycardia.  Otherwise within normal limits.  Normal.  Check lab work.  Consider cardiology Holter monitor and/or echocardiogram.   Follow up: Return in about 1 year (around 04/10/2020) for complete physical.  Orders Placed This Encounter  Procedures  . CBC with Differential/Platelet  . Comprehensive metabolic panel  . Lipid panel  . TSH  . Orthostatic vital signs  . EKG 12-Lead   No orders of the defined types were placed in this encounter.     Lifestyle: Body mass index is 26.37 kg/m. Wt Readings from Last 3 Encounters:  04/11/19 163 lb 6.4 oz (74.1 kg)  01/11/19 162 lb 12.8 oz (73.8 kg)  01/03/18 170 lb 9.6 oz (77.4 kg)   Diet: low fat Exercise: frequently,  Patient Active Problem List   Diagnosis Date Noted  . Family history of liver disease February 18, 2014    Overview:  Mom died from non-alcoholic cirrhosis   . 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 Atrophic vaginitis 12/20/2013  . Mixed hyperlipidemia 11/13/2010   Health Maintenance  Topic Date Due  . INFLUENZA  VACCINE  04/15/2019  . MAMMOGRAM  03/20/2020  . TETANUS/TDAP  12/23/2021  . PAP SMEAR-Modifier  12/31/2021  . COLONOSCOPY  04/20/2024  . Hepatitis C Screening  Completed  . HIV Screening  Completed   Immunization History  Administered Date(s) Administered  . Influenza, Quadrivalent, Recombinant, Inj, Pf 06/18/2018  . Influenza, Seasonal, Injecte, Preservative Fre 07/16/2015, 08/29/2016  . Influenza-Unspecified 06/18/2018  . MMR 09/15/1987  . PPD Test 09/15/1987  . Td 06/06/2002, 09/14/2009  . Tdap 09/14/2009, 12/24/2011   We updated and reviewed the patient's past history in detail and it is documented below. Allergies: Patient has No Known Allergies. Past Medical History Patient  has a past medical history of Colon polyp (04/20/2014), Hyperlipemia, and Internal hemorrhoids without complication. Past Surgical History Patient  has a past surgical history that includes Tonsillectomy. Family History: Patient family history includes Brain cancer in her maternal grandfather; Breast cancer in her sister; COPD in her father; Cirrhosis in her mother; Diabetes in her father; Heart disease in her maternal grandfather; Hyperlipidemia in her father. Social History:  Patient  reports that she has never smoked. She has never used smokeless tobacco. She reports previous alcohol use. She reports that she does not use drugs.  Review of Systems: Constitutional: negative for fever or malaise Ophthalmic: negative for photophobia, double vision or loss of vision Cardiovascular: negative for chest pain, dyspnea on exertion, or new LE swelling Respiratory: negative for SOB or persistent cough Gastrointestinal: negative for abdominal pain, change in bowel habits or melena Genitourinary: negative for dysuria or gross hematuria, no abnormal uterine bleeding or disharge Musculoskeletal: negative for new gait disturbance or muscular weakness Integumentary: negative for new or persistent rashes, no breast  lumps Neurological: negative for TIA or stroke symptoms Psychiatric: negative for SI or delusions Allergic/Immunologic: negative for hives  Patient Care Team    Relationship Specialty Notifications Start End  Leamon Arnt, MD PCP - General Family Medicine  12/14/16   Christain [place], [place] Referring Physician Optometry  01/03/18   Jerelene Redden, DDS     01/03/18     Objective  Vitals: BP 116/78   Pulse (!) 59   Temp 98.1 F (36.7 C) (Oral)   Resp 16   Ht _0  (1.676 m)   Wt 163 lb 6.4 oz (74.1 kg)   LMP 09/27/2012   SpO2 99%   BMI 26.37 kg/m  General:  Well developed, well nourished, no acute distress  Psych:  Alert and orientedx3,normal mood and affect HEENT:  Normocephalic, atraumatic, non-icteric sclera, PERRL, oropharynx is clear without mass or exudate, supple neck without adenopathy, mass or thyromegaly Cardiovascular:  Normal S1, S2, RRR without gallop, rub or murmur, nondisplaced PMI her blood pressures Respiratory:  Good breath sounds bilaterally, CTAB with normal respiratory effort Gastrointestinal: normal bowel sounds, soft, non-tender, no noted masses. No HSM MSK: no deformities, contusions. Joints are without erythema or swelling. Spine and CVA region are nontender Skin:  Warm, no rashes or suspicious lesions noted Neurologic:    Mental status is normal. CN 2-11 are normal. Gross motor and sensory exams are normal. Normal gait. No tremor  Breast Exam: No mass, skin retraction or nipple discharge is appreciated in either breast. No axillary adenopathy. Fibrocystic changes are not noted  EKG brady, otherwise normal. No comparison.  Negative orthostatic hypotensive changes.   Commons side effects, risks, benefits, and alternatives for medications and treatment plan prescribed today were discussed, and the patient expressed understanding of the given instructions. Patient is instructed to call or message via MyChart if he/she has any questions or concerns regarding our  treatment plan. No barriers to understanding were identified. We discussed Red Flag symptoms and signs in detail. Patient expressed understanding regarding what to do in case of urgent or emergency type symptoms.   Medication list was reconciled, printed and provided to the patient in AVS. Patient instructions and summary information was reviewed with the patient as documented in the AVS. This note was prepared with assistance of Dragon voice recognition software. Occasional wrong-word or sound-a-like substitutions may have occurred due to the inherent limitations of voice recognition software

## 2019-04-11 NOTE — Patient Instructions (Addendum)
Please return in 12 months for your annual complete physical; please come fasting.  Your EKG shows a mildly low heart rate but is otherwise normal.   I will release your lab results to you on your MyChart account with further instructions. Please reply with any questions.  If they are all normal, I will get you set up for a heart monitor.   If you have any questions or concerns, please don't hesitate to send me a message via MyChart or call the office at [phone]. Thank you for visiting with [place] today! It's our pleasure caring for you.   Preventive Care 58-4 Years Old, Female Preventive care refers to visits with your health care provider and lifestyle choices that can promote health and wellness. This includes:  A yearly physical exam. This may also be called an annual well check.  Regular dental visits and eye exams.  Immunizations.  Screening for certain conditions.  Healthy lifestyle choices, such as eating a healthy diet, getting regular exercise, not using drugs or products that contain nicotine and tobacco, and limiting alcohol use. What can I expect for my preventive care visit? Physical exam Your health care provider will check your:  Height and weight. This may be used to calculate body mass index (BMI), which tells if you are at a healthy weight.  Heart rate and blood pressure.  Skin for abnormal spots. Counseling Your health care provider may ask you questions about your:  Alcohol, tobacco, and drug use.  Emotional well-being.  Home and relationship well-being.  Sexual activity.  Eating habits.  Work and work Statistician.  Method of birth control.  Menstrual cycle.  Pregnancy history. What immunizations do I need?  Influenza (flu) vaccine  This is recommended every year. Tetanus, diphtheria, and pertussis (Tdap) vaccine  You may need a Td booster every 10 years. Varicella (chickenpox) vaccine  You may need this if you have not been  vaccinated. Zoster (shingles) vaccine  You may need this after age 58. Measles, mumps, and rubella (MMR) vaccine  You may need at least one dose of MMR if you were born in 1957 or later. You may also need a second dose. Pneumococcal conjugate (PCV13) vaccine  You may need this if you have certain conditions and were not previously vaccinated. Pneumococcal polysaccharide (PPSV23) vaccine  You may need one or two doses if you smoke cigarettes or if you have certain conditions. Meningococcal conjugate (MenACWY) vaccine  You may need this if you have certain conditions. Hepatitis A vaccine  You may need this if you have certain conditions or if you travel or work in places where you may be exposed to hepatitis A. Hepatitis B vaccine  You may need this if you have certain conditions or if you travel or work in places where you may be exposed to hepatitis B. Haemophilus influenzae type b (Hib) vaccine  You may need this if you have certain conditions. Human papillomavirus (HPV) vaccine  If recommended by your health care provider, you may need three doses over 6 months. You may receive vaccines as individual doses or as more than one vaccine together in one shot (combination vaccines). Talk with your health care provider about the risks and benefits of combination vaccines. What tests do I need? Blood tests  Lipid and cholesterol levels. These may be checked every 5 years, or more frequently if you are over 3 years old.  Hepatitis C test.  Hepatitis B test. Screening  Lung cancer screening. You may have  this screening every year starting at age 58 if you have a 30-pack-year history of smoking and currently smoke or have quit within the past 15 years.  Colorectal cancer screening. All adults should have this screening starting at age 58 and continuing until age 60. Your health care provider may recommend screening at age 75 if you are at increased risk. You will have tests every  1-10 years, depending on your results and the type of screening test.  Diabetes screening. This is done by checking your blood sugar (glucose) after you have not eaten for a while (fasting). You may have this done every 1-3 years.  Mammogram. This may be done every 1-2 years. Talk with your health care provider about when you should start having regular mammograms. This may depend on whether you have a family history of breast cancer.  BRCA-related cancer screening. This may be done if you have a family history of breast, ovarian, tubal, or peritoneal cancers.  Pelvic exam and Pap test. This may be done every 3 years starting at age 17. Starting at age 22, this may be done every 5 years if you have a Pap test in combination with an HPV test. Other tests  Sexually transmitted disease (STD) testing.  Bone density scan. This is done to screen for osteoporosis. You may have this scan if you are at high risk for osteoporosis. Follow these instructions at home: Eating and drinking  Eat a diet that includes fresh fruits and vegetables, whole grains, lean protein, and low-fat dairy.  Take vitamin and mineral supplements as recommended by your health care provider.  Do not drink alcohol if: ? Your health care provider tells you not to drink. ? You are pregnant, may be pregnant, or are planning to become pregnant.  If you drink alcohol: ? Limit how much you have to 0-1 drink a day. ? Be aware of how much alcohol is in your drink. In the [place] one drink equals one 12 oz bottle of beer (355 mL), one 5 oz glass of wine (148 mL), or one 1 oz glass of hard liquor (44 mL). Lifestyle  Take daily care of your teeth and gums.  Stay active. Exercise for at least 30 minutes on 5 or more days each week.  Do not use any products that contain nicotine or tobacco, such as cigarettes, e-cigarettes, and chewing tobacco. If you need help quitting, ask your health care provider.  If you are sexually active,  practice safe sex. Use a condom or other form of birth control (contraception) in order to prevent pregnancy and STIs (sexually transmitted infections).  If told by your health care provider, take low-dose aspirin daily starting at age 58. What's next?  Visit your health care provider once a year for a well check visit.  Ask your health care provider how often you should have your eyes and teeth checked.  Stay up to date on all vaccines. This information is not intended to replace advice given to you by your health care provider. Make sure you discuss any questions you have with your health care provider. Document Released: 09/27/2015 Document Revised: 05/12/2018 Document Reviewed: 05/12/2018 Elsevier Patient Education  2020 [employer].   Syncope  Syncope refers to a condition in which a person temporarily loses consciousness. Syncope may also be called fainting or passing out. It is caused by a sudden decrease in blood flow to the brain. Even though most causes of syncope are not dangerous, syncope can be a  sign of a serious medical problem. Your health care provider may do tests to find the reason why you are having syncope. Signs that you may be about to faint include:  Feeling dizzy or light-headed.  Feeling nauseous.  Seeing all white or all black in your field of vision.  Having cold, clammy skin. If you faint, get medical help right away. Call your local emergency services (911 in the [place] Do not drive yourself to the hospital. Follow these instructions at home: Pay attention to any changes in your symptoms. Take these actions to stay safe and to help relieve your symptoms: Lifestyle  Do not drive, use machinery, or play sports until your health care provider says it is okay.  Do not drink alcohol.  Do not use any products that contain nicotine or tobacco, such as cigarettes and e-cigarettes. If you need help quitting, ask your health care provider.  Drink enough  fluid to keep your urine pale yellow. General instructions  Take over-the-counter and prescription medicines only as told by your health care provider.  If you are taking blood pressure or heart medicine, get up slowly and take several minutes to sit and then stand. This can reduce dizziness or light-headedness.  Have someone stay with you until you feel stable.  If you start to feel like you might faint, lie down right away and raise (elevate) your feet above the level of your heart. Breathe deeply and steadily. Wait until all the symptoms have passed.  Keep all follow-up visits as told by your health care provider. This is important. Get help right away if you:  Have a severe headache.  Faint once or repeatedly.  Have pain in your chest, abdomen, or back.  Have a very fast or irregular heartbeat (palpitations).  Have pain when you breathe.  Are bleeding from your mouth or rectum, or you have black or tarry stool.  Have a seizure.  Are confused.  Have trouble walking.  Have severe weakness.  Have vision problems. These symptoms may represent a serious problem that is an emergency. Do not wait to see if your symptoms will go away. Get medical help right away. Call your local emergency services (911 in the [place] Do not drive yourself to the hospital. Summary  Syncope refers to a condition in which a person temporarily loses consciousness. It is caused by a sudden decrease in blood flow to the brain.  Signs that you may be about to faint include dizziness, feeling light-headed, feeling nauseous, sudden vision changes, or cold, clammy skin.  Although most causes of syncope are not dangerous, syncope can be a sign of a serious medical problem. If you faint, get medical help right away. This information is not intended to replace advice given to you by your health care provider. Make sure you discuss any questions you have with your health care provider. Document Released:  08/31/2005 Document Revised: 08/13/2017 Document Reviewed: 08/09/2017 Elsevier Patient Education  2020 [employer].

## 2019-04-17 ENCOUNTER — Other Ambulatory Visit: Payer: Self-pay | Admitting: *Deleted

## 2019-04-17 DIAGNOSIS — E875 Hyperkalemia: Secondary | ICD-10-CM

## 2019-04-18 ENCOUNTER — Other Ambulatory Visit: Payer: Self-pay

## 2019-04-18 ENCOUNTER — Other Ambulatory Visit: Payer: Self-pay | Admitting: *Deleted

## 2019-04-18 ENCOUNTER — Other Ambulatory Visit (INDEPENDENT_AMBULATORY_CARE_PROVIDER_SITE_OTHER): Payer: BC Managed Care – PPO

## 2019-04-18 ENCOUNTER — Encounter: Payer: Self-pay | Admitting: Family Medicine

## 2019-04-18 DIAGNOSIS — E875 Hyperkalemia: Secondary | ICD-10-CM

## 2019-04-18 LAB — BASIC METABOLIC PANEL
BUN: 11 mg/dL (ref 6–23)
CO2: 29 mEq/L (ref 19–32)
Calcium: 9.7 mg/dL (ref 8.4–10.5)
Chloride: 103 mEq/L (ref 96–112)
Creatinine, Ser: 0.86 mg/dL (ref 0.40–1.20)
GFR: 67.76 mL/min (ref >60.00)
Glucose, Bld: 91 mg/dL (ref 70–99)
Potassium: 5.6 mEq/L — ABNORMAL HIGH (ref 3.5–5.1)
Sodium: 138 mEq/L (ref 135–145)

## 2019-04-18 MED ORDER — SODIUM POLYSTYRENE SULFONATE PO POWD: ORAL | 0 refills | Status: DC

## 2019-04-18 NOTE — Addendum Note (Signed)
Addended by: Layla Barter on: 04/18/2019 01:58 PM   Modules accepted: Orders

## 2019-04-18 NOTE — Addendum Note (Signed)
Addended by: Billey Chang on: 04/18/2019 11:35 AM   Modules accepted: Orders

## 2019-04-19 ENCOUNTER — Telehealth: Payer: Self-pay | Admitting: Family Medicine

## 2019-04-19 ENCOUNTER — Other Ambulatory Visit: Payer: Self-pay | Admitting: *Deleted

## 2019-04-19 DIAGNOSIS — E875 Hyperkalemia: Secondary | ICD-10-CM

## 2019-04-19 NOTE — Telephone Encounter (Signed)
Copied from [place] [id number]. Topic: General - Other >> Apr 19, 2019  4:50 PM Keene Breath wrote: Reason for CRM: Patient called to ask the nurse to call her back regarding some questions she has on her medication, sodium polystyrene (KAYEXALATE) powder.  CB# [phone]

## 2019-04-20 ENCOUNTER — Encounter: Payer: Self-pay | Admitting: *Deleted

## 2019-04-20 NOTE — Telephone Encounter (Signed)
Spoke to Mrs. Anzaldo, she is wanting to verify if it is 4 tsp 3 times a day or 4 tsp once a day.

## 2019-04-20 NOTE — Telephone Encounter (Signed)
Correction: 4tsp daily for 5 days.

## 2019-04-20 NOTE — Telephone Encounter (Signed)
It is 73ml or 5tsp daily for 5 days.

## 2019-04-20 NOTE — Telephone Encounter (Signed)
Per pt request Mychart message was sent with instructions.

## 2019-04-28 ENCOUNTER — Other Ambulatory Visit (INDEPENDENT_AMBULATORY_CARE_PROVIDER_SITE_OTHER): Payer: BC Managed Care – PPO

## 2019-04-28 ENCOUNTER — Other Ambulatory Visit: Payer: Self-pay

## 2019-04-28 DIAGNOSIS — E875 Hyperkalemia: Secondary | ICD-10-CM

## 2019-04-28 LAB — BASIC METABOLIC PANEL
BUN: 10 mg/dL (ref 6–23)
CO2: 29 mEq/L (ref 19–32)
Calcium: 9.4 mg/dL (ref 8.4–10.5)
Chloride: 102 mEq/L (ref 96–112)
Creatinine, Ser: 0.8 mg/dL (ref 0.40–1.20)
GFR: 73.65 mL/min (ref >60.00)
Glucose, Bld: 83 mg/dL (ref 70–99)
Potassium: 5.2 mEq/L — ABNORMAL HIGH (ref 3.5–5.1)
Sodium: 138 mEq/L (ref 135–145)

## 2019-05-05 LAB — ALDOSTERONE: Aldosterone: 7 ng/dL

## 2019-05-23 ENCOUNTER — Other Ambulatory Visit: Payer: Self-pay | Admitting: *Deleted

## 2019-05-23 DIAGNOSIS — E875 Hyperkalemia: Secondary | ICD-10-CM

## 2019-05-24 ENCOUNTER — Other Ambulatory Visit (INDEPENDENT_AMBULATORY_CARE_PROVIDER_SITE_OTHER): Payer: BC Managed Care – PPO

## 2019-05-24 ENCOUNTER — Other Ambulatory Visit: Payer: Self-pay

## 2019-05-24 DIAGNOSIS — E875 Hyperkalemia: Secondary | ICD-10-CM

## 2019-05-25 ENCOUNTER — Other Ambulatory Visit: Payer: Self-pay | Admitting: *Deleted

## 2019-05-25 DIAGNOSIS — E875 Hyperkalemia: Secondary | ICD-10-CM

## 2019-05-25 LAB — BASIC METABOLIC PANEL
BUN: 18 mg/dL (ref 6–23)
CO2: 31 mEq/L (ref 19–32)
Calcium: 9.4 mg/dL (ref 8.4–10.5)
Chloride: 102 mEq/L (ref 96–112)
Creatinine, Ser: 0.79 mg/dL (ref 0.40–1.20)
GFR: 74.71 mL/min (ref >60.00)
Glucose, Bld: 68 mg/dL — ABNORMAL LOW (ref 70–99)
Potassium: 4.9 mEq/L (ref 3.5–5.1)
Sodium: 138 mEq/L (ref 135–145)

## 2019-08-24 ENCOUNTER — Other Ambulatory Visit (INDEPENDENT_AMBULATORY_CARE_PROVIDER_SITE_OTHER): Payer: BC Managed Care – PPO

## 2019-08-24 DIAGNOSIS — E875 Hyperkalemia: Secondary | ICD-10-CM

## 2019-08-25 LAB — BASIC METABOLIC PANEL
BUN: 20 mg/dL (ref 6–23)
CO2: 27 mEq/L (ref 19–32)
Calcium: 9.1 mg/dL (ref 8.4–10.5)
Chloride: 102 mEq/L (ref 96–112)
Creatinine, Ser: 0.89 mg/dL (ref 0.40–1.20)
GFR: 65.05 mL/min (ref >60.00)
Glucose, Bld: 79 mg/dL (ref 70–99)
Potassium: 4.7 mEq/L (ref 3.5–5.1)
Sodium: 136 mEq/L (ref 135–145)

## 2020-01-01 ENCOUNTER — Telehealth (INDEPENDENT_AMBULATORY_CARE_PROVIDER_SITE_OTHER): Payer: BC Managed Care – PPO | Admitting: Family Medicine

## 2020-01-01 ENCOUNTER — Encounter: Payer: Self-pay | Admitting: Family Medicine

## 2020-01-01 DIAGNOSIS — E782 Mixed hyperlipidemia: Secondary | ICD-10-CM

## 2020-01-01 DIAGNOSIS — N951 Menopausal and female climacteric states: Secondary | ICD-10-CM

## 2020-01-01 MED ORDER — LOVASTATIN 20 MG PO TABS: 20.0000 mg | ORAL_TABLET | Freq: Every day | ORAL | 3 refills | Status: DC

## 2020-01-01 MED ORDER — FLUOXETINE HCL 20 MG PO CAPS: 20.0000 mg | ORAL_CAPSULE | Freq: Every day | ORAL | 3 refills | Status: DC

## 2020-01-01 NOTE — Progress Notes (Signed)
Virtual Visit via Video Note  Subjective  CC:  Chief Complaint  Patient presents with  . Depression    prozac and mevacor  . Hyperlipidemia     I connected with [place] on 01/01/20 at  4:30 PM EDT by a video enabled telemedicine application and verified that I am speaking with the correct person using two identifiers. Location patient: Home Location provider: [facility] [place], Office Persons participating in the virtual visit: [place], Leamon Arnt, MD [place], [place] discussed the limitations of evaluation and management by telemedicine and the availability of in person appointments. The patient expressed understanding and agreed to proceed. HPI: Alexandra Burton is a 59 y.o. female who was contacted today to address the problems listed above in the chief complaint. . Menopausal sxs: remain well controlled on prozac. Due refill. No AEs. May help with stress/anxiety. Has rare hot flush on meds.  Marland Kitchen HLD: on statin w/o Aes or mylagias. Has been controlled.   Assessment  1. Mixed hyperlipidemia   2. Menopausal vasomotor syndrome      Plan   HLD:  Well controlled. Refill statin. Recheck at [facility] in summer  Discussed vasomotor symp and meds. Will continue prozac for now; consider trial wean in the next few years as she may no longer need it. Given stress of this year, defer for now. Refilled.  I discussed the assessment and treatment plan with the patient. The patient was provided an opportunity to ask questions and all were answered. The patient agreed with the plan and demonstrated an understanding of the instructions.   The patient was advised to call back or seek an in-person evaluation if the symptoms worsen or if the condition fails to improve as anticipated. Follow up: No follow-ups on file.  04/25/2020  Meds ordered this encounter  Medications  . FLUoxetine (PROZAC) 20 MG capsule    Sig: Take 1 capsule (20 mg total) by  mouth daily.    Dispense:  90 capsule    Refill:  3  . lovastatin (MEVACOR) 20 MG tablet    Sig: Take 1 tablet (20 mg total) by mouth at bedtime.    Dispense:  90 tablet    Refill:  3      I reviewed the patients updated PMH, FH, and SocHx.    Patient Active Problem List   Diagnosis Date Noted  . Family history of liver disease 01/27/2014  . 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 Mixed hyperlipidemia 11/13/2010   Current Meds  Medication Sig  . FLUoxetine (PROZAC) 20 MG capsule Take 1 capsule (20 mg total) by mouth daily.  Marland Kitchen lovastatin (MEVACOR) 20 MG tablet Take 1 tablet (20 mg total) by mouth at bedtime.  . [DISCONTINUED] FLUoxetine (PROZAC) 20 MG capsule Take 1 capsule (20 mg total) by mouth daily.  . [DISCONTINUED] lovastatin (MEVACOR) 20 MG tablet Take 1 tablet (20 mg total) by mouth at bedtime.    Allergies: Patient has No Known Allergies. Family History: Patient family history includes Brain cancer in her maternal grandfather; Breast cancer in her sister; COPD in her father; Cirrhosis in her mother; Diabetes in her father; Heart disease in her maternal grandfather; Hyperlipidemia in her father. Social History:  Patient  reports that she has never smoked. She has never used smokeless tobacco. She reports previous alcohol use. She reports that she does not use drugs.  Review of Systems: Constitutional: Negative for fever malaise  or anorexia Cardiovascular: negative for chest pain Respiratory: negative for SOB or persistent cough Gastrointestinal: negative for abdominal pain  OBJECTIVE Vitals: LMP 09/27/2012  General: no acute distress , A&Ox3  Leamon Arnt, MD

## 2020-01-03 ENCOUNTER — Other Ambulatory Visit: Payer: Self-pay | Admitting: Family Medicine

## 2020-02-14 ENCOUNTER — Other Ambulatory Visit: Payer: Self-pay | Admitting: Family Medicine

## 2020-02-14 DIAGNOSIS — Z1231 Encounter for screening mammogram for malignant neoplasm of breast: Secondary | ICD-10-CM

## 2020-03-25 ENCOUNTER — Other Ambulatory Visit: Payer: Self-pay

## 2020-03-25 ENCOUNTER — Ambulatory Visit
Admission: RE | Admit: 2020-03-25 | Discharge: 2020-03-25 | Disposition: A | Discharge status: Home / Self Care | Payer: BC Managed Care – PPO | Source: Ambulatory Visit

## 2020-03-25 DIAGNOSIS — Z1231 Encounter for screening mammogram for malignant neoplasm of breast: Secondary | ICD-10-CM

## 2020-04-25 ENCOUNTER — Other Ambulatory Visit: Payer: Self-pay

## 2020-04-25 ENCOUNTER — Ambulatory Visit (INDEPENDENT_AMBULATORY_CARE_PROVIDER_SITE_OTHER): Payer: BC Managed Care – PPO | Admitting: Family Medicine

## 2020-04-25 ENCOUNTER — Encounter: Payer: Self-pay | Admitting: Family Medicine

## 2020-04-25 VITALS — BP 110/80 | HR 61 | Temp 97.9°F | Ht 66.0 in | Wt 171.8 lb

## 2020-04-25 DIAGNOSIS — Z8379 Family history of other diseases of the digestive system: Secondary | ICD-10-CM

## 2020-04-25 DIAGNOSIS — D225 Melanocytic nevi of trunk: Secondary | ICD-10-CM | POA: Diagnosis not present

## 2020-04-25 DIAGNOSIS — Z0001 Encounter for general adult medical examination with abnormal findings: Secondary | ICD-10-CM | POA: Diagnosis not present

## 2020-04-25 DIAGNOSIS — E782 Mixed hyperlipidemia: Secondary | ICD-10-CM

## 2020-04-25 DIAGNOSIS — D229 Melanocytic nevi, unspecified: Secondary | ICD-10-CM

## 2020-04-25 DIAGNOSIS — N951 Menopausal and female climacteric states: Secondary | ICD-10-CM | POA: Diagnosis not present

## 2020-04-25 DIAGNOSIS — K635 Polyp of colon: Secondary | ICD-10-CM | POA: Insufficient documentation

## 2020-04-25 DIAGNOSIS — D489 Neoplasm of uncertain behavior, unspecified: Secondary | ICD-10-CM

## 2020-04-25 DIAGNOSIS — Z0000 Encounter for general adult medical examination without abnormal findings: Secondary | ICD-10-CM

## 2020-04-25 LAB — CBC WITH DIFFERENTIAL/PLATELET
Absolute Monocytes: 616 cells/uL (ref 200–950)
Basophils Absolute: 50 cells/uL (ref 0–200)
Basophils Relative: 0.9 %
Eosinophils Absolute: 149 cells/uL (ref 15–500)
Eosinophils Relative: 2.7 %
HCT: 43.7 % (ref 35.0–45.0)
Hemoglobin: 14.4 g/dL (ref 11.7–15.5)
Lymphs Abs: 2052 cells/uL (ref 850–3900)
MCH: 29.4 pg (ref 27.0–33.0)
MCHC: 33 g/dL (ref 32.0–36.0)
MCV: 89.2 fL (ref 80.0–100.0)
MPV: 9.6 fL (ref 7.5–12.5)
Monocytes Relative: 11.2 %
Neutro Abs: 2635 cells/uL (ref 1500–7800)
Neutrophils Relative %: 47.9 %
Platelets: 255 10*3/uL (ref 140–400)
RBC: 4.9 10*6/uL (ref 3.80–5.10)
RDW: 12.6 % (ref 11.0–15.0)
Total Lymphocyte: 37.3 %
WBC: 5.5 10*3/uL (ref 3.8–10.8)

## 2020-04-25 LAB — LIPID PANEL
Cholesterol: 249 mg/dL — ABNORMAL HIGH (ref <200)
HDL: 63 mg/dL (ref >=50)
LDL Cholesterol (Calc): 160 mg/dL (calc) — ABNORMAL HIGH
Non-HDL Cholesterol (Calc): 186 mg/dL (calc) — ABNORMAL HIGH (ref <130)
Total CHOL/HDL Ratio: 4 (calc) (ref <5.0)
Triglycerides: 136 mg/dL (ref <150)

## 2020-04-25 LAB — COMPREHENSIVE METABOLIC PANEL
AG Ratio: 1.7 (calc) (ref 1.0–2.5)
ALT: 16 U/L (ref 6–29)
AST: 23 U/L (ref 10–35)
Albumin: 4.5 g/dL (ref 3.6–5.1)
Alkaline phosphatase (APISO): 77 U/L (ref 37–153)
BUN: 13 mg/dL (ref 7–25)
CO2: 30 mmol/L (ref 20–32)
Calcium: 9.6 mg/dL (ref 8.6–10.4)
Chloride: 100 mmol/L (ref 98–110)
Creat: 0.84 mg/dL (ref 0.50–1.05)
Globulin: 2.6 g/dL (calc) (ref 1.9–3.7)
Glucose, Bld: 81 mg/dL (ref 65–99)
Potassium: 4.9 mmol/L (ref 3.5–5.3)
Sodium: 136 mmol/L (ref 135–146)
Total Bilirubin: 0.5 mg/dL (ref 0.2–1.2)
Total Protein: 7.1 g/dL (ref 6.1–8.1)

## 2020-04-25 NOTE — Progress Notes (Signed)
Subjective  Chief Complaint  Patient presents with  . Annual Exam    fasting     HPI: Alexandra Burton is a 59 y.o. female who presents to [facility] [place] today for a Female Wellness Visit. She also has the concerns and/or needs as listed above in the chief complaint. These will be addressed in addition to the Health Maintenance Visit.   Wellness Visit: annual visit with health maintenance review and exam without Pap   HM: up to date. Colonoscopy with Dr. Collene Mares last week: no polyps but "inflammation". Will get records. Pap and mammo up to date. Healthy lifestyle.  Chronic disease f/u and/or acute problem visit: (deemed necessary to be done in addition to the wellness visit):  HLD on statin. No concerns.   prozac for menopausal sxs and some mood. Doing great.    Assessment  1. Annual physical exam   2. Mixed hyperlipidemia   3. Menopausal vasomotor syndrome   4. Family history of liver disease   5. Neoplasm of uncertain behavior   6. Atypical mole      Plan  Female Wellness Visit:  Age appropriate Health Maintenance and Prevention measures were discussed with patient. Included topics are cancer screening recommendations, ways to keep healthy (see AVS) including dietary and exercise recommendations, regular eye and dental care, use of seat belts, and avoidance of moderate alcohol use and tobacco use.   BMI: discussed patient's BMI and encouraged positive lifestyle modifications to help get to or maintain a target BMI.  HM needs and immunizations were addressed and ordered. See below for orders. See HM and immunization section for updates.  Routine labs and screening tests ordered including cmp, cbc and lipids where appropriate.  Discussed recommendations regarding Vit D and calcium supplementation (see AVS)  Chronic disease management visit and/or acute problem visit:  Continue statin and prozac for well controlled HLD and menopausal sxs. Check  lipids and lfts  Atypical mole: s/p shave biopsy. Routine post procedure care instructions were given to patient in detail.  Follow up: Return in about 1 year (around 04/25/2021) for complete physical.  Orders Placed This Encounter  Procedures  . Comprehensive metabolic panel  . Lipid panel  . CBC with Differential/Platelet   No orders of the defined types were placed in this encounter.     Lifestyle: Body mass index is 27.73 kg/m. Wt Readings from Last 3 Encounters:  04/25/20 171 lb 12.8 oz (77.9 kg)  04/11/19 163 lb 6.4 oz (74.1 kg)  01/11/19 162 lb 12.8 oz (73.8 kg)    Patient Active Problem List   Diagnosis Date Noted  . Colon polyp 04/25/2020    Prior pcp; last aug 2021: w/o polyp. q 5 year recall. Dr. Collene Mares   . Family history of liver disease 02/22/14    Mom died from non-alcoholic cirrhosis   . Menopausal vasomotor syndrome 12/20/2013    Responsive to prozac   . Mixed hyperlipidemia 11/13/2010   Health Maintenance  Topic Date Due  . INFLUENZA VACCINE  04/14/2020  . MAMMOGRAM  03/25/2021  . TETANUS/TDAP  12/23/2021  . PAP SMEAR-Modifier  12/31/2021  . COLONOSCOPY  04/20/2024  . COVID-19 Vaccine  Completed  . Hepatitis C Screening  Completed  . HIV Screening  Completed   Immunization History  Administered Date(s) Administered  . Influenza, Quadrivalent, Recombinant, Inj, Pf 06/18/2018  . Influenza, Seasonal, Injecte, Preservative Fre 07/16/2015, 08/29/2016  . Influenza-Unspecified 06/18/2018  . MMR 09/15/1987  . PFIZER SARS-COV-2  Vaccination 11/10/2019, 12/02/2019  . PPD Test 09/15/1987  . Td 06/06/2002, 09/14/2009  . Tdap 09/14/2009, 12/24/2011   We updated and reviewed the patient's past history in detail and it is documented below. Allergies: Patient has No Known Allergies. Past Medical History Patient  has a past medical history of Colon polyp (04/20/2014), Hyperlipemia, and Internal hemorrhoids without complication. Past Surgical  History Patient  has a past surgical history that includes Tonsillectomy. Family History: Patient family history includes Brain cancer in her maternal grandfather; Breast cancer in her sister; COPD in her father; Cirrhosis in her mother; Diabetes in her father; Heart disease in her maternal grandfather; Hyperlipidemia in her father. Social History:  Patient  reports that she has never smoked. She has never used smokeless tobacco. She reports previous alcohol use. She reports that she does not use drugs.  Review of Systems: Constitutional: negative for fever or malaise Ophthalmic: negative for photophobia, double vision or loss of vision Cardiovascular: negative for chest pain, dyspnea on exertion, or new LE swelling Respiratory: negative for SOB or persistent cough Gastrointestinal: negative for abdominal pain, change in bowel habits or melena Genitourinary: negative for dysuria or gross hematuria, no abnormal uterine bleeding or disharge Musculoskeletal: negative for new gait disturbance or muscular weakness Integumentary: negative for new or persistent rashes, no breast lumps Neurological: negative for TIA or stroke symptoms Psychiatric: negative for SI or delusions Allergic/Immunologic: negative for hives  Patient Care Team    Relationship Specialty Notifications Start End  Leamon Arnt, MD PCP - General Family Medicine  12/14/16   Christain [place], [place] Referring Physician Optometry  01/03/18   Jerelene Redden, DDS     01/03/18   Juanita Craver, MD Consulting Physician Gastroenterology  04/25/20   [place], Amy, MD Consulting Physician Dermatology  04/25/20     Objective  Vitals: BP 110/80   Pulse 61   Temp 97.9 F (36.6 C) (Temporal)   Ht 5' 6"  (1.676 m)   Wt 171 lb 12.8 oz (77.9 kg)   LMP 09/27/2012   SpO2 98%   BMI 27.73 kg/m  General:  Well developed, well nourished, no acute distress  Psych:  Alert and orientedx3,normal mood and affect HEENT:  Normocephalic, atraumatic,  non-icteric sclera,  supple neck without adenopathy, mass or thyromegaly Cardiovascular:  Normal S1, S2, RRR without gallop, rub or murmur Respiratory:  Good breath sounds bilaterally, CTAB with normal respiratory effort Gastrointestinal: normal bowel sounds, soft, non-tender, no noted masses. No HSM MSK: no deformities, contusions. Joints are without erythema or swelling.  Skin:  Warm, no rashes + atypical dark mole at right upper back/shoulder with irregular borders.  Neurologic:    Mental status is normal. CN 2-11 are normal. Gross motor and sensory exams are normal. Normal gait. No tremor Breast Exam: No mass, skin retraction or nipple discharge is appreciated in either breast. No axillary adenopathy. Fibrocystic changes are not noted  Shave Biopsy Procedure Note  Pre-operative Diagnosis: Suspicious lesion, atypical mole with irregular borders  Post-operative Diagnosis: same  Locations:right upper back  Indications: suspicious  Anesthesia: Lidocaine 1% with epinephrine 0.5 cc  Procedure Details   Patient informed of the risks (including bleeding and infection) and benefits of the  procedure and Verbal informed consent obtained.  The lesion and surrounding area were given a sterile prep using alcohol. A derma blade was used to shave an area of skin approximately 16m by 48m  Hemostasis achieved with alumuninum chloride. Antibiotic ointment and a sterile dressing applied.  The specimen  was sent for pathologic examination. The patient tolerated the procedure well.  EBL: 0 ml  Condition: Stable  Complications: none.   Commons side effects, risks, benefits, and alternatives for medications and treatment plan prescribed today were discussed, and the patient expressed understanding of the given instructions. Patient is instructed to call or message via MyChart if he/she has any questions or concerns regarding our treatment plan. No barriers to understanding were identified. We  discussed Red Flag symptoms and signs in detail. Patient expressed understanding regarding what to do in case of urgent or emergency type symptoms.   Medication list was reconciled, printed and provided to the patient in AVS. Patient instructions and summary information was reviewed with the patient as documented in the AVS. This note was prepared with assistance of Dragon voice recognition software. Occasional wrong-word or sound-a-like substitutions may have occurred due to the inherent limitations of voice recognition software  This visit occurred during the SARS-CoV-2 public health emergency.  Safety protocols were in place, including screening questions prior to the visit, additional usage of staff PPE, and extensive cleaning of exam room while observing appropriate contact time as indicated for disinfecting solutions.

## 2020-04-25 NOTE — Patient Instructions (Addendum)
Please return in 12 months for your annual complete physical; please come fasting.  I will release your lab results to you on your MyChart account with further instructions. Please reply with any questions.    If you have any questions or concerns, please don't hesitate to send me a message via MyChart or call the office at [phone]. Thank you for visiting with [place] today! It's our pleasure caring for you.    Mole Excision Mole excision is a procedure to remove (excise) a mole from your skin. Most moles are noncancerous (are benign) and do not require treatment. Some moles are larger than usual or look like cancerous moles (atypical moles). You may have a mole excision if:  You have an atypical mole that your health care provider thinks should be looked at under a microscope to see if it is cancerous (biopsy).  You have a mole that is causing pain.  You have a mole that you want removed because you do not like the way it looks. Tell a health care provider about:  Any allergies you have.  All medicines you are taking, including vitamins, herbs, eye drops, creams, and over-the-counter medicines.  Any problems you or family members have had with anesthetic medicines.  Any blood disorders you have.  Any surgeries you have had.  Any medical conditions you have.  Whether you are pregnant or may be pregnant. What are the risks? Generally, this is a safe procedure. However, problems may occur, including:  Excessive bleeding.  Infection.  Scarring.  Allergic reactions to medicines.  Damage to the skin or other tissues around the mole. What happens before the procedure?  Ask your health care provider what steps will be taken to help prevent infection. These may include: ? Removing hair around the mole. ? Washing skin with germ-killing soap. What happens during the procedure?      You will be given a medicine to numb the area (local anesthetic).  Your health care  provider will outline the mole with ink and mark the center with a dot. This will serve as a guide during the procedure.  Depending on the size of your mole, your health care provider will remove it using: ? A surgical blade. The mole will be cut out or shaved off (shave excision). ? A hollow tube with a sharp end (punch device). This may be used for larger moles.  Your health care provider may use stitches (sutures) to close the wound in the skin where the mole was removed (excision site).  A bandage (dressing) may be applied over the area. The procedure may vary among health care providers and hospitals. What can I expect after procedure?  Your blood pressure, heart rate, breathing rate, and blood oxygen level will be monitored until you leave the hospital or clinic.  You may return to your normal activities as told by your health care provider.  It is up to you to get any test results. If a sample will be tested in a lab, ask your health care provider or the department that is doing the procedure when your results will be ready.  Talk with your health care provider about what your results mean.  After the procedure, it is common to have: ? Mild pain. Your pain may increase as the anesthetic medicine wears off. ? Mild redness and swelling. Follow these instructions at home: Incision care   Follow instructions from your health care provider about how to take care of your incision. Make  sure you: ? Wash your hands with soap and water before you change your bandage (dressing). If soap and water are not available, use hand sanitizer. ? Change your dressing as told by your health care provider. ? Leave stitches (sutures), skin glue, or adhesive strips in place. These skin closures may need to stay in place for 2 weeks or longer. If adhesive strip edges start to loosen and curl up, you may trim the loose edges. Do not remove adhesive strips completely unless your health care provider tells  you to do that.  Check your incision area every day for signs of infection. Check for: ? More redness, swelling, or pain. ? Fluid or blood. ? Warmth ? Pus or a bad smell. General instructions  Take over-the-counter and prescription medicines only as told by your health care provider.  Follow instructions from your health care provider about how to minimize scarring. Avoid sun exposure until the area has healed. Scarring should lessen over time. ? To help prevent scarring, make sure to cover your wound with sunscreen of at least SPF 30 after the wound has healed and all skin closures have been removed or fallen off.  Keep all follow-up visits as told by your health care provider. This is important. Contact a health care provider if:  A mole grows back in the same place where a mole had been removed.  You have a fever.  You have more redness, swelling, or pain at the incision site.  You have fluid or blood coming from your incision site.  Your incision site feels warm to the touch.  You have pus or a bad smell coming from your incision site.  Your incision site feels numb for several days after the procedure. Summary  Mole excision is a procedure to remove (excise) a mole from your skin.  You will be given a medicine to numb the area (local anesthetic) during the procedure to remove the mole.  After the procedure, it is common to have mild pain and redness.  Wash your hands with soap and water before you change your bandage (dressing). If soap and water are not available, use hand sanitizer.  Contact your health care provider if you have problems or questions. This information is not intended to replace advice given to you by your health care provider. Make sure you discuss any questions you have with your health care provider. Document Revised: 09/16/2017 Document Reviewed: 09/13/2017 Elsevier Patient Education  2020 [place].  Mole A mole is a colored (pigmented)  growth on the skin. Moles are very common. They are usually harmless, but some moles can become cancerous over time. What are the causes? Moles are caused when pigmented skin cells grow together in clusters instead of spreading out in the skin as they normally do. The reason why the skin cells grow together in clusters is not known. What increases the risk? You are more likely to develop a mole if you:  Have family members who have moles.  Are white.  Have blond hair.  Are often outdoors and exposed to the sun.  Received phototherapy when you were a newborn baby.  Are female. What are the signs or symptoms? A mole may be:  [place] or black.  Flat or raised.  Smooth or wrinkled. How is this diagnosed? A mole is diagnosed with a skin exam. If your health care provider thinks a mole may be cancerous, all or part of the mole will be removed for testing (biopsy). How  is this treated? Most moles are noncancerous (benign) and do not require treatment. If a mole is found to be cancerous, it will be removed. You may also choose to have a mole removed if it is causing pain or if you do not like the way it looks. Follow these instructions at home: General instructions   Every month, look for new moles and check your existing moles for changes. This is important because a change in a mole can mean that the mole has become cancerous.  ABCDE changes in a mole indicate that you should be evaluated by your health care provider. ABCDE stands for: ? Asymmetry. This means the mole has an irregular shape. It is not round or oval. ? Border. This means the mole has an irregular or bumpy border. ? Color. This means the mole has multiple colors in it, including brown, black, blue, red, or tan. Note that it is normal for moles to get darker when a woman is pregnant or takes birth control pills. ? Diameter. This means the mole is more than 0.2 inches (6 mm) across. ? Evolving. This refers to any unusual  changes or symptoms in the mole, such as pain, itching, stinging, sensitivity, or bleeding.  If you have a large number of moles, see a skin doctor (dermatologist) at least one time every year for a full-body skin check. Lifestyle   When you are outdoors, wear sunscreen with SPF 30 (sun protection factor 30) or higher.  Use an adequate amount of sunscreen to cover exposed areas of skin. Put it on 30 minutes before you go out. Reapply it every 2 hours or anytime you come out of the water.  When you are out in the sun, wear a broad-brimmed hat and clothing that covers your arms and legs. Wear wraparound sunglasses. Contact a health care provider if:  The size, shape, borders, or color of your mole changes.  Your mole, or the skin near the mole, becomes painful, sore, red, or swollen.  Your mole: ? Develops more than one color. ? Itches or bleeds. ? Becomes scaly, sheds skin, or oozes fluid. ? Becomes flat or develops raised areas. ? Becomes hard or soft.  You develop a new mole. Summary  A mole is a colored (pigmented) growth on the skin. Moles are very common. They are usually harmless, but some moles can become cancerous over time.  Every month, look for new moles and check your existing moles for changes. This is important because a change in a mole can mean that the mole has become cancerous.  If you have a large number of moles, see a skin doctor (dermatologist) at least one time every year for a full-body skin check.  When you are outdoors, wear sunscreen with SPF 30 (sun protection factor 30) or higher. Reapply it every 2 hours or anytime you come out of the water.  Contact a health care provider if you notice changes in a mole or if you develop a new mole. This information is not intended to replace advice given to you by your health care provider. Make sure you discuss any questions you have with your health care provider. Document Revised: 04/06/2019 Document Reviewed:  01/25/2018 Elsevier Patient Education  [place].

## 2020-04-25 NOTE — Addendum Note (Signed)
Addended by: Doran Clay A on: 04/25/2020 01:44 PM   Modules accepted: Orders

## 2020-05-03 ENCOUNTER — Encounter: Payer: Self-pay | Admitting: Family Medicine

## 2020-08-05 ENCOUNTER — Ambulatory Visit: Payer: BC Managed Care – PPO | Admitting: Family Medicine

## 2020-08-05 ENCOUNTER — Other Ambulatory Visit: Payer: Self-pay

## 2020-08-05 ENCOUNTER — Encounter: Payer: Self-pay | Admitting: Family Medicine

## 2020-08-05 VITALS — BP 119/68 | HR 55 | Temp 95.4°F | Ht 66.0 in | Wt 174.0 lb

## 2020-08-05 DIAGNOSIS — L988 Other specified disorders of the skin and subcutaneous tissue: Secondary | ICD-10-CM

## 2020-08-05 DIAGNOSIS — D239 Other benign neoplasm of skin, unspecified: Secondary | ICD-10-CM

## 2020-08-05 NOTE — Patient Instructions (Signed)
Please return in 7-10 days for suture removal.   If you have any questions or concerns, please don't hesitate to send me a message via MyChart or call the office at [phone]. Thank you for visiting with [place] today! It's our pleasure caring for you.    Mole Excision Mole excision is a procedure to remove (excise) a mole from your skin. Most moles are noncancerous (are benign) and do not require treatment. Some moles are larger than usual or look like cancerous moles (atypical moles). You may have a mole excision if:  You have an atypical mole that your health care provider thinks should be looked at under a microscope to see if it is cancerous (biopsy).  You have a mole that is causing pain.  You have a mole that you want removed because you do not like the way it looks. Tell a health care provider about:  Any allergies you have.  All medicines you are taking, including vitamins, herbs, eye drops, creams, and over-the-counter medicines.  Any problems you or family members have had with anesthetic medicines.  Any blood disorders you have.  Any surgeries you have had.  Any medical conditions you have.  Whether you are pregnant or may be pregnant. What are the risks? Generally, this is a safe procedure. However, problems may occur, including:  Excessive bleeding.  Infection.  Scarring.  Allergic reactions to medicines.  Damage to the skin or other tissues around the mole. What happens before the procedure?  Ask your health care provider what steps will be taken to help prevent infection. These may include: ? Removing hair around the mole. ? Washing skin with germ-killing soap. What happens during the procedure?      You will be given a medicine to numb the area (local anesthetic).  Your health care provider will outline the mole with ink and mark the center with a dot. This will serve as a guide during the procedure.  Depending on the size of your mole, your  health care provider will remove it using: ? A surgical blade. The mole will be cut out or shaved off (shave excision). ? A hollow tube with a sharp end (punch device). This may be used for larger moles.  Your health care provider may use stitches (sutures) to close the wound in the skin where the mole was removed (excision site).  A bandage (dressing) may be applied over the area. The procedure may vary among health care providers and hospitals. What can I expect after procedure?  Your blood pressure, heart rate, breathing rate, and blood oxygen level will be monitored until you leave the hospital or clinic.  You may return to your normal activities as told by your health care provider.  It is up to you to get any test results. If a sample will be tested in a lab, ask your health care provider or the department that is doing the procedure when your results will be ready.  Talk with your health care provider about what your results mean.  After the procedure, it is common to have: ? Mild pain. Your pain may increase as the anesthetic medicine wears off. ? Mild redness and swelling. Follow these instructions at home: Incision care   Follow instructions from your health care provider about how to take care of your incision. Make sure you: ? Wash your hands with soap and water before you change your bandage (dressing). If soap and water are not available, use hand sanitizer. ?  Change your dressing as told by your health care provider. ? Leave stitches (sutures), skin glue, or adhesive strips in place. These skin closures may need to stay in place for 2 weeks or longer. If adhesive strip edges start to loosen and curl up, you may trim the loose edges. Do not remove adhesive strips completely unless your health care provider tells you to do that.  Check your incision area every day for signs of infection. Check for: ? More redness, swelling, or pain. ? Fluid or blood. ? Warmth ? Pus or a  bad smell. General instructions  Take over-the-counter and prescription medicines only as told by your health care provider.  Follow instructions from your health care provider about how to minimize scarring. Avoid sun exposure until the area has healed. Scarring should lessen over time. ? To help prevent scarring, make sure to cover your wound with sunscreen of at least SPF 30 after the wound has healed and all skin closures have been removed or fallen off.  Keep all follow-up visits as told by your health care provider. This is important. Contact a health care provider if:  A mole grows back in the same place where a mole had been removed.  You have a fever.  You have more redness, swelling, or pain at the incision site.  You have fluid or blood coming from your incision site.  Your incision site feels warm to the touch.  You have pus or a bad smell coming from your incision site.  Your incision site feels numb for several days after the procedure. Summary  Mole excision is a procedure to remove (excise) a mole from your skin.  You will be given a medicine to numb the area (local anesthetic) during the procedure to remove the mole.  After the procedure, it is common to have mild pain and redness.  Wash your hands with soap and water before you change your bandage (dressing). If soap and water are not available, use hand sanitizer.  Contact your health care provider if you have problems or questions. This information is not intended to replace advice given to you by your health care provider. Make sure you discuss any questions you have with your health care provider. Document Revised: 09/16/2017 Document Reviewed: 09/13/2017 Elsevier Patient Education  2020 [employer].

## 2020-08-05 NOTE — Progress Notes (Signed)
Subjective  CC:  Chief Complaint  Patient presents with  . Mole removal    HPI: Alexandra Burton is a 59 y.o. female who presents to the office today to address the problems listed above in the chief complaint.  59 year old female who had a shave biopsy done of an atypical mole right upper back done back in August.  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  Margins were not completely clear, wider excisional biopsy was recommended.  Patient returns to have this done.  She is has not noted any return of the mole or abnormal growths in the area.  She feels well.  No history of melanoma.2 Assessment  1. Dysplastic nevus      Plan   Dysplastic nevus with severe atypia: Discussed pathology report, diagnosis and prognosis.  Due to risk of progression and unclear margins, patient consents to excisional biopsy.  Wide excisional biopsy done.  Send off to path.  Routine post biopsy instructions given.  Return in 7 to 10 days for suture removal.  Advil if needed.  Await pathology report.  Follow up: Suture removal 08/14/2020  No orders of the defined types were placed in this encounter.  No orders of the defined types were placed in this encounter.     I reviewed the patients updated PMH, FH, and SocHx.    Patient Active Problem List   Diagnosis Date Noted  . Colon polyp 04/25/2020  . Family history of liver disease 01/27/2014  . 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 Mixed hyperlipidemia 11/13/2010   Current Meds  Medication Sig  . FLUoxetine (PROZAC) 20 MG capsule Take 1 capsule (20 mg total) by mouth daily.  Marland Kitchen lovastatin (MEVACOR) 20 MG tablet Take 1 tablet (20 mg total) by mouth at bedtime.    Allergies: Patient has No Known Allergies. Family History: Patient family history includes Brain cancer in her maternal grandfather; Breast cancer in her sister; COPD in her father; Cirrhosis in her mother; Diabetes in her father; Heart  disease in her maternal grandfather; Hyperlipidemia in her father. Social History:  Patient  reports that she has never smoked. She has never used smokeless tobacco. She reports previous alcohol use. She reports that she does not use drugs.  Review of Systems: Constitutional: Negative for fever malaise or anorexia Cardiovascular: negative for chest pain Respiratory: negative for SOB or persistent cough Gastrointestinal: negative for abdominal pain  Objective  Vitals: BP 119/68   Pulse (!) 55   Temp (!) 95.4 F (35.2 C)   Ht 5\' 6"  (1.676 m)   Wt 174 lb (78.9 kg)   LMP 09/27/2012   SpO2 100%   BMI 28.08 kg/m  General: no acute distress , A&Ox3 Right upper back with a well-healed shave biopsy site.  No nevus or pigment noted. Skin:  Warm, no rashes  Excisional Biopsy Procedure Note  Pre-operative Diagnosis: Dysplastic nevi  Post-operative Diagnosis: same  Locations: Right upper back  Indications: Dysplastic nevus with severe atypia and without clear margins  Anesthesia: Lidocaine 1% with epinephrine without added sodium bicarbonate  Procedure Details  The risks, benefits, indications, potential complications, and alternatives were explained to the patient and informed consent obtained.  The lesion and surrounding area was given a sterile prep using betadyne and draped in the usual sterile fashion. A scalpel was used to excise an elliptical area of skin approximately 1cm by 3cm. The wound was closed with 5x5-0 Ethilon using simple interrupted stitches. Antibiotic ointment and  a sterile dressing applied. The specimen was sent for pathologic examination. The patient tolerated the procedure well.  EBL: minimal  Patient tolerated procedure well.  There were no complications   Commons side effects, risks, benefits, and alternatives for medications and treatment plan prescribed today were discussed, and the patient expressed understanding of the given instructions. Patient is  instructed to call or message via MyChart if he/she has any questions or concerns regarding our treatment plan. No barriers to understanding were identified. We discussed Red Flag symptoms and signs in detail. Patient expressed understanding regarding what to do in case of urgent or emergency type symptoms.   Medication list was reconciled, printed and provided to the patient in AVS. Patient instructions and summary information was reviewed with the patient as documented in the AVS. This note was prepared with assistance of Dragon voice recognition software. Occasional wrong-word or sound-a-like substitutions may have occurred due to the inherent limitations of voice recognition software  This visit occurred during the SARS-CoV-2 public health emergency.  Safety protocols were in place, including screening questions prior to the visit, additional usage of staff PPE, and extensive cleaning of exam room while observing appropriate contact time as indicated for disinfecting solutions.

## 2020-08-06 ENCOUNTER — Encounter: Payer: Self-pay | Admitting: Family Medicine

## 2020-08-13 ENCOUNTER — Telehealth: Payer: Self-pay

## 2020-08-13 NOTE — Telephone Encounter (Signed)
Provided mole location for Alexandra Burton  Right upper back

## 2020-08-13 NOTE — Telephone Encounter (Signed)
Missy is calling from [facility] to give Dr.Andy the biopsy report, asked for a call back.

## 2020-08-14 ENCOUNTER — Ambulatory Visit: Payer: BC Managed Care – PPO | Admitting: Family Medicine

## 2020-08-14 ENCOUNTER — Other Ambulatory Visit: Payer: Self-pay

## 2020-08-14 ENCOUNTER — Encounter: Payer: Self-pay | Admitting: Family Medicine

## 2020-08-14 VITALS — BP 130/70 | HR 59 | Temp 97.7°F | Ht 66.0 in | Wt 174.0 lb

## 2020-08-14 DIAGNOSIS — Z4802 Encounter for removal of sutures: Secondary | ICD-10-CM

## 2020-08-14 NOTE — Progress Notes (Signed)
Subjective  CC:  Chief Complaint  Patient presents with  . Suture / Staple Removal    HPI: Alexandra Burton is a 59 y.o. female who presents to the office today to address the problems listed above in the chief complaint.  58 year old for suture removal.  Had reexcision of mole due to unclear margin.  Pathology was reviewed.  Margins are clear.  No further dysplastic nevus noted.  Patient has done well.  No problems or complications noted.  No pain.   Assessment  1. Visit for suture removal      Plan   Dysplastic nevi with reexcision here for suture removal: Sutures removed without difficulty.  Routine care given.  Healing well.  Follow up: As needed Visit date not found  No orders of the defined types were placed in this encounter.  No orders of the defined types were placed in this encounter.     I reviewed the patients updated PMH, FH, and SocHx.    Patient Active Problem List   Diagnosis Date Noted  . Colon polyp 04/25/2020  . Family history of liver disease 01/27/2014  . 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 Mixed hyperlipidemia 11/13/2010   Current Meds  Medication Sig  . FLUoxetine (PROZAC) 20 MG capsule Take 1 capsule (20 mg total) by mouth daily.  Marland Kitchen lovastatin (MEVACOR) 20 MG tablet Take 1 tablet (20 mg total) by mouth at bedtime.    Allergies: Patient has No Known Allergies. Family History: Patient family history includes Brain cancer in her maternal grandfather; Breast cancer in her sister; COPD in her father; Cirrhosis in her mother; Diabetes in her father; Heart disease in her maternal grandfather; Hyperlipidemia in her father. Social History:  Patient  reports that she has never smoked. She has never used smokeless tobacco. She reports previous alcohol use. She reports that she does not use drugs.  Review of Systems: Constitutional: Negative for fever malaise or anorexia Cardiovascular: negative for chest pain Respiratory: negative  for SOB or persistent cough Gastrointestinal: negative for abdominal pain  Objective  Vitals: BP 130/70   Pulse (!) 59   Temp 97.7 F (36.5 C) (Temporal)   Ht 5\' 6"  (1.676 m)   Wt 174 lb (78.9 kg)   LMP 09/27/2012   SpO2 98%   BMI 28.08 kg/m  General: no acute distress , A&Ox3 Right upper back with well-healed wound: Suture removal: Alcohol use to clean wound.  5 interrupted Ethilon sutures were removed with scissors and pickup.  Routine fashion.  No adverse effects.  Wound remained closed.  2 Steri-Strips applied.  Bandage applied.    Commons side effects, risks, benefits, and alternatives for medications and treatment plan prescribed today were discussed, and the patient expressed understanding of the given instructions. Patient is instructed to call or message via MyChart if he/she has any questions or concerns regarding our treatment plan. No barriers to understanding were identified. We discussed Red Flag symptoms and signs in detail. Patient expressed understanding regarding what to do in case of urgent or emergency type symptoms.   Medication list was reconciled, printed and provided to the patient in AVS. Patient instructions and summary information was reviewed with the patient as documented in the AVS. This note was prepared with assistance of Dragon voice recognition software. Occasional wrong-word or sound-a-like substitutions may have occurred due to the inherent limitations of voice recognition software  This visit occurred during the SARS-CoV-2 public health emergency.  Safety protocols were in place, including screening  questions prior to the visit, additional usage of staff PPE, and extensive cleaning of exam room while observing appropriate contact time as indicated for disinfecting solutions.

## 2020-12-20 ENCOUNTER — Other Ambulatory Visit: Payer: Self-pay | Admitting: Family Medicine

## 2021-02-05 ENCOUNTER — Other Ambulatory Visit: Payer: Self-pay | Admitting: Family Medicine

## 2021-02-06 ENCOUNTER — Encounter: Payer: Self-pay | Admitting: Family Medicine

## 2021-02-06 ENCOUNTER — Other Ambulatory Visit: Payer: Self-pay

## 2021-02-06 ENCOUNTER — Ambulatory Visit: Payer: BC Managed Care – PPO | Admitting: Family Medicine

## 2021-02-06 VITALS — BP 124/84 | HR 64 | Temp 98.1°F | Resp 16 | Ht 66.0 in | Wt 174.6 lb

## 2021-02-06 DIAGNOSIS — N898 Other specified noninflammatory disorders of vagina: Secondary | ICD-10-CM | POA: Diagnosis not present

## 2021-02-06 DIAGNOSIS — R300 Dysuria: Secondary | ICD-10-CM

## 2021-02-06 LAB — POCT URINALYSIS DIPSTICK
Bilirubin, UA: POSITIVE
Blood, UA: NEGATIVE
Glucose, UA: NEGATIVE
Ketones, UA: NEGATIVE
Nitrite, UA: NEGATIVE
Protein, UA: NEGATIVE
Spec Grav, UA: 1.01 (ref 1.010–1.025)
Urobilinogen, UA: 0.2 E.U./dL
pH, UA: 5 (ref 5.0–8.0)

## 2021-02-06 MED ORDER — NITROFURANTOIN MONOHYD MACRO 100 MG PO CAPS: 100.0000 mg | ORAL_CAPSULE | Freq: Two times a day (BID) | ORAL | 0 refills | Status: DC

## 2021-02-06 MED ORDER — FLUCONAZOLE 150 MG PO TABS: 150.0000 mg | ORAL_TABLET | Freq: Once | ORAL | 0 refills | Status: AC

## 2021-02-06 NOTE — Patient Instructions (Signed)
I suspect you do have an early or mild urinary tract infection.  I will check the urine culture to make sure, but for now can start the antibiotic Macrodantin twice per day for the next 1 week.  Continue drink plenty of fluids, see information below.  If itching gets worse or does not improve in the next few days, fill the prescription for the antifungal Diflucan.  If itching returns after antibiotics, can also use Diflucan as antibiotics can increase risk for yeast infection.  If any worsening symptoms or not improving with current treatment, please be seen.  Thank you for coming in today.  Return to the clinic or go to the nearest emergency room if any of your symptoms worsen or new symptoms occur.   Urinary Tract Infection, Adult  A urinary tract infection (UTI) is an infection of any part of the urinary tract. The urinary tract includes the kidneys, ureters, bladder, and urethra. These organs make, store, and get rid of urine in the body. An upper UTI affects the ureters and kidneys. A lower UTI affects the bladder and urethra. What are the causes? Most urinary tract infections are caused by bacteria in your genital area around your urethra, where urine leaves your body. These bacteria grow and cause inflammation of your urinary tract. What increases the risk? You are more likely to develop this condition if:  You have a urinary catheter that stays in place.  You are not able to control when you urinate or have a bowel movement (incontinence).  You are female and you: ? Use a spermicide or diaphragm for birth control. ? Have low estrogen levels. ? Are pregnant.  You have certain genes that increase your risk.  You are sexually active.  You take antibiotic medicines.  You have a condition that causes your flow of urine to slow down, such as: ? An enlarged prostate, if you are female. ? Blockage in your urethra. ? A kidney stone. ? A nerve condition that affects your bladder control  (neurogenic bladder). ? Not getting enough to drink, or not urinating often.  You have certain medical conditions, such as: ? Diabetes. ? A weak disease-fighting system (immunesystem). ? Sickle cell disease. ? Gout. ? Spinal cord injury. What are the signs or symptoms? Symptoms of this condition include:  Needing to urinate right away (urgency).  Frequent urination. This may include small amounts of urine each time you urinate.  Pain or burning with urination.  Blood in the urine.  Urine that smells bad or unusual.  Trouble urinating.  Cloudy urine.  Vaginal discharge, if you are female.  Pain in the abdomen or the lower back. You may also have:  Vomiting or a decreased appetite.  Confusion.  Irritability or tiredness.  A fever or chills.  Diarrhea. The first symptom in older adults may be confusion. In some cases, they may not have any symptoms until the infection has worsened. How is this diagnosed? This condition is diagnosed based on your medical history and a physical exam. You may also have other tests, including:  Urine tests.  Blood tests.  Tests for STIs (sexually transmitted infections). If you have had more than one UTI, a cystoscopy or imaging studies may be done to determine the cause of the infections. How is this treated? Treatment for this condition includes:  Antibiotic medicine.  Over-the-counter medicines to treat discomfort.  Drinking enough water to stay hydrated. If you have frequent infections or have other conditions such as a  kidney stone, you may need to see a health care provider who specializes in the urinary tract (urologist). In rare cases, urinary tract infections can cause sepsis. Sepsis is a life-threatening condition that occurs when the body responds to an infection. Sepsis is treated in the hospital with IV antibiotics, fluids, and other medicines. Follow these instructions at home: Medicines  Take over-the-counter and  prescription medicines only as told by your health care provider.  If you were prescribed an antibiotic medicine, take it as told by your health care provider. Do not stop using the antibiotic even if you start to feel better. General instructions  Make sure you: ? Empty your bladder often and completely. Do not hold urine for long periods of time. ? Empty your bladder after sex. ? Wipe from front to back after urinating or having a bowel movement if you are female. Use each tissue only one time when you wipe.  Drink enough fluid to keep your urine pale yellow.  Keep all follow-up visits. This is important.   Contact a health care provider if:  Your symptoms do not get better after 1-2 days.  Your symptoms go away and then return. Get help right away if:  You have severe pain in your back or your lower abdomen.  You have a fever or chills.  You have nausea or vomiting. Summary  A urinary tract infection (UTI) is an infection of any part of the urinary tract, which includes the kidneys, ureters, bladder, and urethra.  Most urinary tract infections are caused by bacteria in your genital area.  Treatment for this condition often includes antibiotic medicines.  If you were prescribed an antibiotic medicine, take it as told by your health care provider. Do not stop using the antibiotic even if you start to feel better.  Keep all follow-up visits. This is important. This information is not intended to replace advice given to you by your health care provider. Make sure you discuss any questions you have with your health care provider. Document Revised: 04/12/2020 Document Reviewed: 04/12/2020 Elsevier Patient Education  [employer].

## 2021-02-06 NOTE — Progress Notes (Signed)
Subjective:  Patient ID: Alexandra Burton, female    DOB: [birth year]  Age: 60 y.o. MRN: [id number]  CC:  Chief Complaint  Patient presents with  . Dysuria    Pt reports burning, some abdominal discomfort, thinks she saw some blood in her urine, denies low back pain, pt reports she tries to stay hydrated.     HPI Alexandra Burton presents for   Dysuria: Started with some dysuria 3 days ago - slight itch, burning. Better during the day, then returned next morning. Noted blood in urine 2 mornings ago. Has not recurred.  Some increased urinary frequency past few days.  No fever, no flank/back pain.  Slight internal vaginal itchng in am only. No discharge. No recent swimming/vacation. No recent abx. Slight itch this am.   Last uti years ago - fever and chills at that time.   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 Tx: Azo last night, cranberry juice, water  History Patient Active Problem List   Diagnosis Date Noted  . Colon polyp 04/25/2020  . Family history of liver disease 01/27/2014  . 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 Mixed hyperlipidemia 11/13/2010   Past Medical History:  Diagnosis Date  . Colon polyp 04/20/2014  . Hyperlipemia   . Internal hemorrhoids without complication    history of internal hemorrhoids   Past Surgical History:  Procedure Laterality Date  . TONSILLECTOMY     No Known Allergies Prior to Admission medications   Medication Sig Start Date End Date Taking? Authorizing Provider  FLUoxetine (PROZAC) 20 MG capsule TAKE ONE CAPSULE BY MOUTH DAILY 12/20/20  Yes Leamon Arnt, MD  lovastatin (MEVACOR) 20 MG tablet Take one tablet by mouth at bedtime. Please schedule an office visit for further refills. 02/05/21  Yes Leamon Arnt, MD   Social History   Socioeconomic History  . Marital status: Married    Spouse name: Not on file  . Number of children: Not on file  . Years of education: Not on file  . Highest education level: Not on  file  Occupational History  . Occupation: TEACHER 1st grade     Employer: [employer]  Tobacco Use  . Smoking status: Never Smoker  . Smokeless tobacco: Never Used  Vaping Use  . Vaping Use: Never used  Substance and Sexual Activity  . Alcohol use: Not Currently  . Drug use: Never  . Sexual activity: Yes  Other Topics Concern  . Not on file  Social History Narrative  . Not on file   Social Determinants of Health   Financial Resource Strain: Not on file  Food Insecurity: Not on file  Transportation Needs: Not on file  Physical Activity: Not on file  Stress: Not on file  Social Connections: Not on file  Intimate Partner Violence: Not on file    Review of Systems Per HPI.   Objective:   Vitals:   02/06/21 1154  BP: 124/84  Pulse: 64  Resp: 16  Temp: 98.1 F (36.7 C)  TempSrc: Temporal  SpO2: 100%  Weight: 174 lb 9.6 oz (79.2 kg)  Height: 5\' 6"  (1.676 m)     Physical Exam Vitals reviewed.  Constitutional:      Appearance: Normal appearance. She is well-developed.  HENT:     Head: Normocephalic and atraumatic.  Pulmonary:     Effort: Pulmonary effort is normal.  Abdominal:     General: There is no distension.     Palpations: Abdomen is soft.  Tenderness: There is no abdominal tenderness. There is no right CVA tenderness, left CVA tenderness, guarding or rebound.  Skin:    General: Skin is warm.  Neurological:     Mental Status: She is alert and oriented to person, place, and time.  Psychiatric:        Behavior: Behavior normal.       Results for orders placed or performed in visit on 02/06/21  POCT Urinalysis Dipstick  Result Value Ref Range   Color, UA orange    Clarity, UA cloudy    Glucose, UA Negative Negative   Bilirubin, UA positive    Ketones, UA negative    Spec Grav, UA 1.010 1.010 - 1.025   Blood, UA negative    pH, UA 5.0 5.0 - 8.0   Protein, UA Negative Negative   Urobilinogen, UA 0.2 0.2 or 1.0 E.U./dL    Nitrite, UA negative    Leukocytes, UA Small (1+) (A) Negative   Appearance     Odor      Assessment & Plan:  Shaunie Boehm is a 60 y.o. female . Dysuria - Plan: POCT Urinalysis Dipstick, Urine Culture, nitrofurantoin, macrocrystal-monohydrate, (MACROBID) 100 MG capsule  Vagina itching - Plan: fluconazole (DIFLUCAN) 150 MG tablet  Suspected urinary tract infection/cystitis with possible hemorrhagic cystitis with blood previously, not seen on current testing.  Positive LE without nitrites.  Minimal itching without discharge, differential includes candidal vaginitis.  -Check urine culture, start Macrobid, potential risks of antibiotics discussed.  -If itching is not improving next few days, start Diflucan.  Additionally start Diflucan if symptoms of itching recur after use of antibiotics or worsen.  -Okay to continue cranberry, fluids, Azo if needed.  -RTC precautions.  Meds ordered this encounter  Medications  . nitrofurantoin, macrocrystal-monohydrate, (MACROBID) 100 MG capsule    Sig: Take 1 capsule (100 mg total) by mouth 2 (two) times daily.    Dispense:  14 capsule    Refill:  0  . fluconazole (DIFLUCAN) 150 MG tablet    Sig: Take 1 tablet (150 mg total) by mouth once for 1 dose.    Dispense:  1 tablet    Refill:  0   Patient Instructions   I suspect you do have an early or mild urinary tract infection.  I will check the urine culture to make sure, but for now can start the antibiotic Macrodantin twice per day for the next 1 week.  Continue drink plenty of fluids, see information below.  If itching gets worse or does not improve in the next few days, fill the prescription for the antifungal Diflucan.  If itching returns after antibiotics, can also use Diflucan as antibiotics can increase risk for yeast infection.  If any worsening symptoms or not improving with current treatment, please be seen.  Thank you for coming in today.  Return to the clinic or go to the nearest  emergency room if any of your symptoms worsen or new symptoms occur.   Urinary Tract Infection, Adult  A urinary tract infection (UTI) is an infection of any part of the urinary tract. The urinary tract includes the kidneys, ureters, bladder, and urethra. These organs make, store, and get rid of urine in the body. An upper UTI affects the ureters and kidneys. A lower UTI affects the bladder and urethra. What are the causes? Most urinary tract infections are caused by bacteria in your genital area around your urethra, where urine leaves your body. These bacteria grow and cause  inflammation of your urinary tract. What increases the risk? You are more likely to develop this condition if:  You have a urinary catheter that stays in place.  You are not able to control when you urinate or have a bowel movement (incontinence).  You are female and you: ? Use a spermicide or diaphragm for birth control. ? Have low estrogen levels. ? Are pregnant.  You have certain genes that increase your risk.  You are sexually active.  You take antibiotic medicines.  You have a condition that causes your flow of urine to slow down, such as: ? An enlarged prostate, if you are female. ? Blockage in your urethra. ? A kidney stone. ? A nerve condition that affects your bladder control (neurogenic bladder). ? Not getting enough to drink, or not urinating often.  You have certain medical conditions, such as: ? Diabetes. ? A weak disease-fighting system (immunesystem). ? Sickle cell disease. ? Gout. ? Spinal cord injury. What are the signs or symptoms? Symptoms of this condition include:  Needing to urinate right away (urgency).  Frequent urination. This may include small amounts of urine each time you urinate.  Pain or burning with urination.  Blood in the urine.  Urine that smells bad or unusual.  Trouble urinating.  Cloudy urine.  Vaginal discharge, if you are female.  Pain in the abdomen  or the lower back. You may also have:  Vomiting or a decreased appetite.  Confusion.  Irritability or tiredness.  A fever or chills.  Diarrhea. The first symptom in older adults may be confusion. In some cases, they may not have any symptoms until the infection has worsened. How is this diagnosed? This condition is diagnosed based on your medical history and a physical exam. You may also have other tests, including:  Urine tests.  Blood tests.  Tests for STIs (sexually transmitted infections). If you have had more than one UTI, a cystoscopy or imaging studies may be done to determine the cause of the infections. How is this treated? Treatment for this condition includes:  Antibiotic medicine.  Over-the-counter medicines to treat discomfort.  Drinking enough water to stay hydrated. If you have frequent infections or have other conditions such as a kidney stone, you may need to see a health care provider who specializes in the urinary tract (urologist). In rare cases, urinary tract infections can cause sepsis. Sepsis is a life-threatening condition that occurs when the body responds to an infection. Sepsis is treated in the hospital with IV antibiotics, fluids, and other medicines. Follow these instructions at home: Medicines  Take over-the-counter and prescription medicines only as told by your health care provider.  If you were prescribed an antibiotic medicine, take it as told by your health care provider. Do not stop using the antibiotic even if you start to feel better. General instructions  Make sure you: ? Empty your bladder often and completely. Do not hold urine for long periods of time. ? Empty your bladder after sex. ? Wipe from front to back after urinating or having a bowel movement if you are female. Use each tissue only one time when you wipe.  Drink enough fluid to keep your urine pale yellow.  Keep all follow-up visits. This is important.   Contact a  health care provider if:  Your symptoms do not get better after 1-2 days.  Your symptoms go away and then return. Get help right away if:  You have severe pain in your back or your  lower abdomen.  You have a fever or chills.  You have nausea or vomiting. Summary  A urinary tract infection (UTI) is an infection of any part of the urinary tract, which includes the kidneys, ureters, bladder, and urethra.  Most urinary tract infections are caused by bacteria in your genital area.  Treatment for this condition often includes antibiotic medicines.  If you were prescribed an antibiotic medicine, take it as told by your health care provider. Do not stop using the antibiotic even if you start to feel better.  Keep all follow-up visits. This is important. This information is not intended to replace advice given to you by your health care provider. Make sure you discuss any questions you have with your health care provider. Document Revised: 04/12/2020 Document Reviewed: 04/12/2020 Elsevier Patient Education  2021 [place].      Signed, Merri Ray, MD [facility]

## 2021-02-08 LAB — URINE CULTURE
MICRO NUMBER:: 11938790
SPECIMEN QUALITY:: ADEQUATE

## 2021-02-17 ENCOUNTER — Other Ambulatory Visit: Payer: Self-pay | Admitting: Family Medicine

## 2021-02-17 DIAGNOSIS — Z1231 Encounter for screening mammogram for malignant neoplasm of breast: Secondary | ICD-10-CM

## 2021-03-17 ENCOUNTER — Other Ambulatory Visit: Payer: Self-pay | Admitting: Family Medicine

## 2021-04-14 ENCOUNTER — Other Ambulatory Visit: Payer: Self-pay

## 2021-04-14 ENCOUNTER — Ambulatory Visit
Admission: RE | Admit: 2021-04-14 | Discharge: 2021-04-14 | Disposition: A | Discharge status: Home / Self Care | Payer: BC Managed Care – PPO | Source: Ambulatory Visit

## 2021-04-14 DIAGNOSIS — Z1231 Encounter for screening mammogram for malignant neoplasm of breast: Secondary | ICD-10-CM

## 2021-05-03 ENCOUNTER — Other Ambulatory Visit: Payer: Self-pay | Admitting: Family Medicine

## 2021-06-02 ENCOUNTER — Encounter: Payer: BC Managed Care – PPO | Admitting: Family Medicine

## 2021-06-16 ENCOUNTER — Other Ambulatory Visit: Payer: Self-pay | Admitting: Family Medicine

## 2021-06-19 ENCOUNTER — Encounter: Payer: Self-pay | Admitting: Family Medicine

## 2021-06-19 ENCOUNTER — Ambulatory Visit (INDEPENDENT_AMBULATORY_CARE_PROVIDER_SITE_OTHER): Payer: BC Managed Care – PPO | Admitting: Family Medicine

## 2021-06-19 ENCOUNTER — Other Ambulatory Visit: Payer: Self-pay

## 2021-06-19 VITALS — BP 128/80 | HR 55 | Temp 98.1°F | Ht 66.0 in | Wt 177.6 lb

## 2021-06-19 DIAGNOSIS — N951 Menopausal and female climacteric states: Secondary | ICD-10-CM | POA: Diagnosis not present

## 2021-06-19 DIAGNOSIS — R202 Paresthesia of skin: Secondary | ICD-10-CM | POA: Diagnosis not present

## 2021-06-19 DIAGNOSIS — K635 Polyp of colon: Secondary | ICD-10-CM | POA: Diagnosis not present

## 2021-06-19 DIAGNOSIS — Z23 Encounter for immunization: Secondary | ICD-10-CM

## 2021-06-19 DIAGNOSIS — Z0001 Encounter for general adult medical examination with abnormal findings: Secondary | ICD-10-CM

## 2021-06-19 DIAGNOSIS — E782 Mixed hyperlipidemia: Secondary | ICD-10-CM | POA: Diagnosis not present

## 2021-06-19 DIAGNOSIS — Z0000 Encounter for general adult medical examination without abnormal findings: Secondary | ICD-10-CM

## 2021-06-19 LAB — CBC WITH DIFFERENTIAL/PLATELET
Basophils Absolute: 0.1 10*3/uL (ref 0.0–0.1)
Basophils Relative: 0.9 % (ref 0.0–3.0)
Eosinophils Absolute: 0.1 10*3/uL (ref 0.0–0.7)
Eosinophils Relative: 1.7 % (ref 0.0–5.0)
HCT: 42.9 % (ref 36.0–46.0)
Hemoglobin: 13.9 g/dL (ref 12.0–15.0)
Lymphocytes Relative: 34.6 % (ref 12.0–46.0)
Lymphs Abs: 2.2 10*3/uL (ref 0.7–4.0)
MCHC: 32.5 g/dL (ref 30.0–36.0)
MCV: 88.5 fl (ref 78.0–100.0)
Monocytes Absolute: 0.6 10*3/uL (ref 0.1–1.0)
Monocytes Relative: 9.2 % (ref 3.0–12.0)
Neutro Abs: 3.4 10*3/uL (ref 1.4–7.7)
Neutrophils Relative %: 53.6 % (ref 43.0–77.0)
Platelets: 250 10*3/uL (ref 150.0–400.0)
RBC: 4.85 Mil/uL (ref 3.87–5.11)
RDW: 13.4 % (ref 11.5–15.5)
WBC: 6.3 10*3/uL (ref 4.0–10.5)

## 2021-06-19 LAB — VITAMIN B12: Vitamin B-12: 447 pg/mL (ref 211–911)

## 2021-06-19 LAB — LIPID PANEL
Cholesterol: 191 mg/dL (ref 0–200)
HDL: 64.6 mg/dL (ref >39.00)
LDL Cholesterol: 109 mg/dL — ABNORMAL HIGH (ref 0–99)
NonHDL: 126.43
Total CHOL/HDL Ratio: 3
Triglycerides: 87 mg/dL (ref 0.0–149.0)
VLDL: 17.4 mg/dL (ref 0.0–40.0)

## 2021-06-19 LAB — COMPREHENSIVE METABOLIC PANEL
ALT: 20 U/L (ref 0–35)
AST: 30 U/L (ref 0–37)
Albumin: 4.3 g/dL (ref 3.5–5.2)
Alkaline Phosphatase: 76 U/L (ref 39–117)
BUN: 16 mg/dL (ref 6–23)
CO2: 30 mEq/L (ref 19–32)
Calcium: 9.5 mg/dL (ref 8.4–10.5)
Chloride: 102 mEq/L (ref 96–112)
Creatinine, Ser: 0.81 mg/dL (ref 0.40–1.20)
GFR: 79.02 mL/min (ref >60.00)
Glucose, Bld: 82 mg/dL (ref 70–99)
Potassium: 4.9 mEq/L (ref 3.5–5.1)
Sodium: 138 mEq/L (ref 135–145)
Total Bilirubin: 0.5 mg/dL (ref 0.2–1.2)
Total Protein: 7.2 g/dL (ref 6.0–8.3)

## 2021-06-19 LAB — VITAMIN D 25 HYDROXY (VIT D DEFICIENCY, FRACTURES): VITD: 33.62 ng/mL (ref 30.00–100.00)

## 2021-06-19 LAB — TSH: TSH: 1.34 u[IU]/mL (ref 0.35–5.50)

## 2021-06-19 MED ORDER — LOVASTATIN 20 MG PO TABS: 20.0000 mg | ORAL_TABLET | Freq: Every day | ORAL | 3 refills | Status: DC

## 2021-06-19 MED ORDER — FLUOXETINE HCL 20 MG PO CAPS: 20.0000 mg | ORAL_CAPSULE | Freq: Every day | ORAL | 3 refills | Status: DC

## 2021-06-19 NOTE — Addendum Note (Signed)
Addended by: Gean Birchwood on: 06/19/2021 10:35 AM   Modules accepted: Orders

## 2021-06-19 NOTE — Progress Notes (Signed)
Subjective  Chief Complaint  Patient presents with   Annual Exam   Hyperlipidemia    HPI: Alexandra Burton is a 60 y.o. female who presents to [facility] at [facility] today for a Female Wellness Visit. She also has the concerns and/or needs as listed above in the chief complaint. These will be addressed in addition to the Health Maintenance Visit.   Wellness Visit: annual visit with health maintenance review and exam without Pap  HM: pap due next year. CRC screen up to date done 04/2020; need report. Reportedly no polyps. On q 5 yr recall. Mammo up to date. Shingrix and flu eligible.  Chronic disease f/u and/or acute problem visit: (deemed necessary to be done in addition to the wellness visit): HLD on simvastatin 20 nightly for many years now and doing fine. No Aes. Diet is good. Healthy lifestyle Menopausal sxs: prozac 20 and well controlled for years. Prefers to continue. Mood is good as well.  H/o colon polyps: reviewed last 2 colonoscopy records: see Dr. Collene Mares. No bleeding. On 5 yr recall.  C/o strange sensation on the underside of her right 3-5th toes. Noted a few months ago. Not painful or numb, no burning or tingling but a strange sensation. No change in appearance. No claudication. Wears supportive wide toe box shoes.   Assessment  1. Annual physical exam   2. Mixed hyperlipidemia   3. Menopausal vasomotor syndrome   4. Polyp of colon, unspecified part of colon, unspecified type   5. Paresthesia of right foot      Plan  Female Wellness Visit: Age appropriate Health Maintenance and Prevention measures were discussed with patient. Included topics are cancer screening recommendations, ways to keep healthy (see AVS) including dietary and exercise recommendations, regular eye and dental care, use of seat belts, and avoidance of moderate alcohol use and tobacco use. Screens are current BMI: discussed patient's BMI and encouraged positive lifestyle modifications to  help get to or maintain a target BMI. HM needs and immunizations were addressed and ordered. See below for orders. See HM and immunization section for updates. Flu and shingrix today after counseling Routine labs and screening tests ordered including cmp, cbc and lipids where appropriate. Discussed recommendations regarding Vit D and calcium supplementation (see AVS)  Chronic disease management visit and/or acute problem visit: HLD: recheck today fasting on simvastatin 20. Monitor lfts Menopausal sxs: continue prozac 20 daily. Well controlled.  Foot paresthesias: due to mechanical early hammer toe changes. Reassured. Nl neurovascular exam. Supportive shoes. See AVS for education. Check vit b12 and D and tsh.  Colon polyp: will request colonoscopy reprt from last year.dr. Collene Mares  Follow up: Return in about 1 year (around 06/19/2022) for complete physical.  Orders Placed This Encounter  Procedures   CBC with Differential/Platelet   Comprehensive metabolic panel   Lipid panel   TSH   Vitamin B12   VITAMIN D 25 Hydroxy (Vit-D Deficiency, Fractures)    Meds ordered this encounter  Medications   lovastatin (MEVACOR) 20 MG tablet    Sig: Take 1 tablet (20 mg total) by mouth at bedtime.    Dispense:  90 tablet    Refill:  3    Please keep on file for next due refill.   FLUoxetine (PROZAC) 20 MG capsule    Sig: Take 1 capsule (20 mg total) by mouth daily.    Dispense:  90 capsule    Refill:  3    Please hold for next refill  due, 1 year supply       Body mass index is 28.67 kg/m. Wt Readings from Last 3 Encounters:  06/19/21 177 lb 9.6 oz (80.6 kg)  02/06/21 174 lb 9.6 oz (79.2 kg)  08/14/20 174 lb (78.9 kg)     Patient Active Problem List   Diagnosis Date Noted   Colon polyp 04/25/2020    Prior pcp; last aug 2021: w/o polyp. q 5 year recall. Dr. Collene Mares    Family history of liver disease 25-Feb-2014    Mom died from non-alcoholic cirrhosis    Menopausal vasomotor syndrome  12/20/2013    Responsive to prozac    Mixed hyperlipidemia 11/13/2010   Health Maintenance  Topic Date Due   Zoster Vaccines- Shingrix (1 of 2) Never done   COVID-19 Vaccine (3 - Booster for Pfizer series) 05/03/2020   INFLUENZA VACCINE  04/14/2021   TETANUS/TDAP  12/23/2021   PAP SMEAR-Modifier  12/31/2021   MAMMOGRAM  04/14/2022   COLONOSCOPY (Pts 45-51yr Insurance coverage will need to be confirmed)  04/14/2025   Hepatitis C Screening  Completed   HIV Screening  Completed   HPV VACCINES  Aged Out   Immunization History  Administered Date(s) Administered   Influenza, Quadrivalent, Recombinant, Inj, Pf 06/18/2018   Influenza, Seasonal, Injecte, Preservative Fre 07/16/2015, 08/29/2016   Influenza-Unspecified 06/18/2018, 06/24/2019, 08/05/2020   MMR 09/15/1987   PFIZER(Purple Top)SARS-COV-2 Vaccination 11/10/2019, 12/02/2019   PPD Test 09/15/1987   Td 06/06/2002, 09/14/2009   Tdap 09/14/2009, 12/24/2011   We updated and reviewed the patient's past history in detail and it is documented below. Allergies: Patient has No Known Allergies. Past Medical History Patient  has a past medical history of Colon polyp (04/20/2014), Hyperlipemia, and Internal hemorrhoids without complication. Past Surgical History Patient  has a past surgical history that includes Tonsillectomy. Family History: Patient family history includes Brain cancer in her maternal grandfather; Breast cancer in her sister; COPD in her father; Cirrhosis in her mother; Diabetes in her father; Heart disease in her maternal grandfather; Hyperlipidemia in her father. Social History:  Patient  reports that she has never smoked. She has never used smokeless tobacco. She reports that she does not currently use alcohol. She reports that she does not use drugs.  Review of Systems: Constitutional: negative for fever or malaise Ophthalmic: negative for photophobia, double vision or loss of vision Cardiovascular: negative for  chest pain, dyspnea on exertion, or new LE swelling Respiratory: negative for SOB or persistent cough Gastrointestinal: negative for abdominal pain, change in bowel habits or melena Genitourinary: negative for dysuria or gross hematuria, no abnormal uterine bleeding or disharge Musculoskeletal: negative for new gait disturbance or muscular weakness Integumentary: negative for new or persistent rashes, no breast lumps Neurological: negative for TIA or stroke symptoms Psychiatric: negative for SI or delusions Allergic/Immunologic: negative for hives  Patient Care Team    Relationship Specialty Notifications Start End  ALeamon Arnt MD PCP - General Family Medicine  12/14/16   PChristain Sacramento OCherawReferring Physician Optometry  01/03/18   WJerelene Redden DDS     01/03/18   MJuanita Craver MD Consulting Physician Gastroenterology  04/25/20   JMartinique Amy, MD Consulting Physician Dermatology  04/25/20     Objective  Vitals: BP 128/80   Pulse (!) 55   Temp 98.1 F (36.7 C) (Temporal)   Ht 5' 6"  (1.676 m)   Wt 177 lb 9.6 oz (80.6 kg)   LMP 09/27/2012   SpO2 97%   BMI 28.67  kg/m  General:  Well developed, well nourished, no acute distress  Psych:  Alert and orientedx3,normal mood and affect HEENT:  Normocephalic, atraumatic, non-icteric sclera,  supple neck without adenopathy, mass or thyromegaly Cardiovascular:  Normal S1, S2, RRR without gallop, rub or murmur Respiratory:  Good breath sounds bilaterally, CTAB with normal respiratory effort Gastrointestinal: normal bowel sounds, soft, non-tender, no noted masses. 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 No other deformities. Non tender. Nl pedal pulses and sensation to light touch. Joints are without erythema or swelling.  Skin:  Warm, no rashes or suspicious lesions noted Neurologic:    Mental status is normal. Gross motor and sensory exams are normal. Normal gait. No tremor   Commons side effects, risks, benefits, and  alternatives for medications and treatment plan prescribed today were discussed, and the patient expressed understanding of the given instructions. Patient is instructed to call or message via MyChart if he/she has any questions or concerns regarding our treatment plan. No barriers to understanding were identified. We discussed Red Flag symptoms and signs in detail. Patient expressed understanding regarding what to do in case of urgent or emergency type symptoms.  Medication list was reconciled, printed and provided to the patient in AVS. Patient instructions and summary information was reviewed with the patient as documented in the AVS. This note was prepared with assistance of Dragon voice recognition software. Occasional wrong-word or sound-a-like substitutions may have occurred due to the inherent limitations of voice recognition software  This visit occurred during the SARS-CoV-2 public health emergency.  Safety protocols were in place, including screening questions prior to the visit, additional usage of staff PPE, and extensive cleaning of exam room while observing appropriate contact time as indicated for disinfecting solutions.

## 2021-06-19 NOTE — Patient Instructions (Signed)
Please return in 12 months for your annual complete physical; please come fasting. Please schedule a nurse visit in 2-6 months for your 2nd Shingrix vaccination.  I will release your lab results to you on your MyChart account with further instructions. Please reply with any questions.    Today you were given your Flu and 1st of 2 Shingrix vaccinations.    If you have any questions or concerns, please don't hesitate to send me a message via MyChart or call the office at [phone]. Thank you for visiting with [place] today! It's our pleasure caring for you.   Hammer Toe Hammer toe is a change in the shape, or a deformity, of the toe. The deformity causes the middle joint of the toe to stay bent. Hammer toe starts gradually. At first, the toe can be straightened. Then over time, the toe deformity becomes stiff, inflexible, and permanently bent. Hammer toe usually affects the second, third, or fourth toe. A hammer toe causes pain, especially when wearing shoes. Corns and calluses can result from the toe rubbing against the inside of the shoe. Early treatments to keep the toe straight may relieve pain. As the deformity of the toe becomes stiff and permanent, surgery may be needed to straighten the toe. What are the causes? This condition is caused by abnormal bending of the toe joint that is closest to your foot. Over time, the toe bending downward pulls on the muscles and connections (tendons) of the toe joint, making them weak and stiff. Wearing shoes that are too narrow in the toe box and do not allow toes to fully straighten can cause this condition. What increases the risk? You are more likely to develop this condition if you: Are an older female. Wear shoes that are too small, or wear high-heeled shoes that pinch your toes. Have a second toe that is longer than your big toe (first toe). Injure your foot or toe. Have arthritis, or have a nerve or muscle disorder. Have diabetes or a condition known  as Charcot joint, which may cause you to walk abnormally. Have a family history of hammer toe. Are a Engineer, mining. What are the signs or symptoms? Pain and deformity of the toe are the main symptoms of this condition. The pain is worse when wearing shoes, walking, or running. Other symptoms may include: A thickened patch of skin, called a corn or callus, that forms over the top of the bent part of the toe or between the toes. Redness and a burning feeling on the bent toe. An open sore that forms on the top of the bent toe. Not being able to straighten the affected toe. How is this diagnosed? This condition is diagnosed based on your symptoms and a physical exam. During the exam, your health care provider will try to straighten your toe to see how stiff the deformity is. You may also have tests, such as: A blood test to check for rheumatoid arthritis or diabetes. An X-ray to show how severe the toe deformity is. How is this treated? Treatment for this condition depends on whether the toe is flexible or deformed and no longer moveable. In less severe cases, a hammer toe can be straightened without surgery. These treatments include: Taping the toe into a straightened position. Using pads and cushions to protect the bent toe. Wearing shoes that provide enough room for the toes. Doing toe-stretching exercises at home. Taking an NSAID, such as ibuprofen, to reduce pain and swelling. Using special orthotics or  insoles for pain relief and to improve walking. If these treatments do not help or the toe has a severe deformity and cannot be straightened, surgery is the next option. The most common surgeries used to straighten a hammer toe include: Arthroplasty or osteotomy. Part of the toe joint is reconstructed or removed, which allows the toe to straighten. Fusion. Cartilage between the two bones of the joint is taken out, and the bones are fused together into one longer bone. Implantation. Part of  the bone is removed and replaced with an implant to allow the toe to move again. Flexor tendon transfer. The tendons that curl the toes down (flexor tendons) are repositioned. Follow these instructions at home: Take over-the-counter and prescription medicines only as told by your health care provider. Do toe-straightening and stretching exercises as told by your health care provider. Keep all follow-up visits. This is important. How is this prevented? Wear shoes that fit properly and give your toes enough room. Shoes should not cause pain. Buy shoes at the end of the day to make sure they fit well, since your foot may swell during the day. Make sure they are comfortable before you buy them. As you age, your shoe size might change, including the width. Measure both feet and buy shoes for the larger foot. A shoe repair store might be able to stretch shoes that feel tight in spots. Do not wear high-heeled shoes or shoes with pointed toes. Contact a health care provider if: Your pain gets worse. Your toe becomes red or swollen. You develop an open sore on your toe. Summary Hammer toe is a condition that gradually causes your toe to become bent and stiff. Hammer toe can be treated by taping the toe into a straightened position and doing toe-stretching exercises. If these treatments do not help, surgery may be needed. To prevent this condition, wear shoes that fit properly, give your toes enough room, and do not cause pain. This information is not intended to replace advice given to you by your health care provider. Make sure you discuss any questions you have with your health care provider. Document Revised: 12/07/2019 Document Reviewed: 12/07/2019 Elsevier Patient Education  2022 [employer].

## 2021-09-02 ENCOUNTER — Ambulatory Visit (INDEPENDENT_AMBULATORY_CARE_PROVIDER_SITE_OTHER): Payer: BC Managed Care – PPO

## 2021-09-02 DIAGNOSIS — Z23 Encounter for immunization: Secondary | ICD-10-CM | POA: Diagnosis not present

## 2021-09-21 ENCOUNTER — Encounter: Payer: Self-pay | Admitting: Family Medicine

## 2022-01-29 ENCOUNTER — Encounter: Payer: Self-pay | Admitting: Family Medicine

## 2022-04-24 ENCOUNTER — Ambulatory Visit (HOSPITAL_BASED_OUTPATIENT_CLINIC_OR_DEPARTMENT_OTHER)
Admission: RE | Admit: 2022-04-24 | Discharge: 2022-04-24 | Disposition: A | Discharge status: Home / Self Care | Payer: BC Managed Care – PPO | Source: Ambulatory Visit | Attending: Family Medicine | Admitting: Family Medicine

## 2022-04-24 ENCOUNTER — Encounter (HOSPITAL_BASED_OUTPATIENT_CLINIC_OR_DEPARTMENT_OTHER): Payer: Self-pay | Admitting: Radiology

## 2022-04-24 ENCOUNTER — Other Ambulatory Visit (HOSPITAL_BASED_OUTPATIENT_CLINIC_OR_DEPARTMENT_OTHER): Payer: Self-pay | Admitting: Family Medicine

## 2022-04-24 DIAGNOSIS — Z1231 Encounter for screening mammogram for malignant neoplasm of breast: Secondary | ICD-10-CM | POA: Diagnosis not present

## 2022-06-08 ENCOUNTER — Encounter: Payer: Self-pay | Admitting: *Deleted

## 2022-07-31 ENCOUNTER — Other Ambulatory Visit: Payer: Self-pay | Admitting: Family Medicine

## 2022-09-01 ENCOUNTER — Ambulatory Visit (INDEPENDENT_AMBULATORY_CARE_PROVIDER_SITE_OTHER): Payer: BC Managed Care – PPO | Admitting: Family Medicine

## 2022-09-01 ENCOUNTER — Encounter: Payer: Self-pay | Admitting: Family Medicine

## 2022-09-01 ENCOUNTER — Telehealth: Payer: Self-pay

## 2022-09-01 ENCOUNTER — Other Ambulatory Visit (HOSPITAL_COMMUNITY)
Admission: RE | Admit: 2022-09-01 | Discharge: 2022-09-01 | Disposition: A | Discharge status: Home / Self Care | Payer: BC Managed Care – PPO | Source: Ambulatory Visit | Attending: Family Medicine | Admitting: Family Medicine

## 2022-09-01 VITALS — BP 110/80 | HR 52 | Temp 97.7°F | Ht 66.0 in | Wt 161.0 lb

## 2022-09-01 DIAGNOSIS — Z0000 Encounter for general adult medical examination without abnormal findings: Secondary | ICD-10-CM

## 2022-09-01 DIAGNOSIS — K635 Polyp of colon: Secondary | ICD-10-CM | POA: Diagnosis not present

## 2022-09-01 DIAGNOSIS — N952 Postmenopausal atrophic vaginitis: Secondary | ICD-10-CM

## 2022-09-01 DIAGNOSIS — Z803 Family history of malignant neoplasm of breast: Secondary | ICD-10-CM

## 2022-09-01 DIAGNOSIS — R001 Bradycardia, unspecified: Secondary | ICD-10-CM | POA: Diagnosis not present

## 2022-09-01 DIAGNOSIS — Z124 Encounter for screening for malignant neoplasm of cervix: Secondary | ICD-10-CM

## 2022-09-01 DIAGNOSIS — N951 Menopausal and female climacteric states: Secondary | ICD-10-CM | POA: Diagnosis not present

## 2022-09-01 DIAGNOSIS — Z23 Encounter for immunization: Secondary | ICD-10-CM

## 2022-09-01 DIAGNOSIS — E782 Mixed hyperlipidemia: Secondary | ICD-10-CM

## 2022-09-01 LAB — COMPREHENSIVE METABOLIC PANEL
ALT: 15 U/L (ref 0–35)
AST: 25 U/L (ref 0–37)
Albumin: 4.6 g/dL (ref 3.5–5.2)
Alkaline Phosphatase: 73 U/L (ref 39–117)
BUN: 15 mg/dL (ref 6–23)
CO2: 28 mEq/L (ref 19–32)
Calcium: 10.2 mg/dL (ref 8.4–10.5)
Chloride: 103 mEq/L (ref 96–112)
Creatinine, Ser: 0.91 mg/dL (ref 0.40–1.20)
GFR: 68.14 mL/min (ref >60.00)
Glucose, Bld: 87 mg/dL (ref 70–99)
Potassium: 6.3 mEq/L (ref 3.5–5.1)
Sodium: 142 mEq/L (ref 135–145)
Total Bilirubin: 0.7 mg/dL (ref 0.2–1.2)
Total Protein: 7.2 g/dL (ref 6.0–8.3)

## 2022-09-01 LAB — CBC WITH DIFFERENTIAL/PLATELET
Basophils Absolute: 0 10*3/uL (ref 0.0–0.1)
Basophils Relative: 0.5 % (ref 0.0–3.0)
Eosinophils Absolute: 0.1 10*3/uL (ref 0.0–0.7)
Eosinophils Relative: 0.8 % (ref 0.0–5.0)
HCT: 42.2 % (ref 36.0–46.0)
Hemoglobin: 14.2 g/dL (ref 12.0–15.0)
Lymphocytes Relative: 30.4 % (ref 12.0–46.0)
Lymphs Abs: 2.1 10*3/uL (ref 0.7–4.0)
MCHC: 33.7 g/dL (ref 30.0–36.0)
MCV: 89.5 fl (ref 78.0–100.0)
Monocytes Absolute: 0.6 10*3/uL (ref 0.1–1.0)
Monocytes Relative: 8.8 % (ref 3.0–12.0)
Neutro Abs: 4.1 10*3/uL (ref 1.4–7.7)
Neutrophils Relative %: 59.5 % (ref 43.0–77.0)
Platelets: 252 10*3/uL (ref 150.0–400.0)
RBC: 4.72 Mil/uL (ref 3.87–5.11)
RDW: 13.4 % (ref 11.5–15.5)
WBC: 7 10*3/uL (ref 4.0–10.5)

## 2022-09-01 LAB — LIPID PANEL
Cholesterol: 209 mg/dL — ABNORMAL HIGH (ref 0–200)
HDL: 68.1 mg/dL (ref >39.00)
LDL Cholesterol: 125 mg/dL — ABNORMAL HIGH (ref 0–99)
NonHDL: 141.21
Total CHOL/HDL Ratio: 3
Triglycerides: 79 mg/dL (ref 0.0–149.0)
VLDL: 15.8 mg/dL (ref 0.0–40.0)

## 2022-09-01 MED ORDER — FLUOXETINE HCL 20 MG PO CAPS: 20.0000 mg | ORAL_CAPSULE | Freq: Every day | ORAL | 3 refills | Status: DC

## 2022-09-01 NOTE — Telephone Encounter (Signed)
[place] from [place] [facility] called with a critical for [facility], Dr. Jonni Sanger patient with Critically high potassium of 6.3.

## 2022-09-01 NOTE — Progress Notes (Signed)
Subjective  Chief Complaint  Patient presents with   Annual Exam    Pt here for Annual Exam and is currently fasting     HPI: Alexandra Burton is a 61 y.o. female who presents to [place] at [place] today for a Female Wellness Visit. She also has the concerns and/or needs as listed above in the chief complaint. These will be addressed in addition to the Health Maintenance Visit.   Wellness Visit: annual visit with health maintenance review and exam with Pap  HM: pap with HR HPV today; other screens are current; eligible for tdap today. Exercises regularly.  Chronic disease f/u and/or acute problem visit: (deemed necessary to be done in addition to the wellness visit): Menopausal sxs: mood, vasomotor and vaginal atrophy. Uses lubrication. Prozac is helpful.  HLD on statin. Tolerates. Eats well Loletha Grayer, chronic H/o polyps.  FH of metastatic breast cancer sister (premenopausal). Remains on treatment.   Assessment  1. Annual physical exam   2. Cervical cancer screening   3. Polyp of colon, unspecified part of colon, unspecified type   4. Menopausal vasomotor syndrome   5. Mixed hyperlipidemia   6. Chronic sinus bradycardia   7. Vaginal atrophy   8. Need for Tdap vaccination   9. Family history of breast cancer in sister      Plan  Female Wellness Visit: Age appropriate Health Maintenance and Prevention measures were discussed with patient. Included topics are cancer screening recommendations, ways to keep healthy (see AVS) including dietary and exercise recommendations, regular eye and dental care, use of seat belts, and avoidance of moderate alcohol use and tobacco use. Pap today BMI: discussed patient's BMI and encouraged positive lifestyle modifications to help get to or maintain a target BMI. HM needs and immunizations were addressed and ordered. See below for orders. See HM and immunization section for updates. Tdap updated today Routine labs and  screening tests ordered including cmp, cbc and lipids where appropriate. Discussed recommendations regarding Vit D and calcium supplementation (see AVS)  Chronic disease management visit and/or acute problem visit: HLD: recheck fasting lipids on simvastatin 20 qhs. Check liver tests Menopausal sxs controlled on prozac 20. Declines vaginal estrogens.  Brady: monitor for near syncope. (Happens with exercise if she arises too quickly) Colon polyps: surveillance is current and next due 2026  Follow up: 12 mo for cpe  Orders Placed This Encounter  Procedures   CBC with Differential/Platelet   Comprehensive metabolic panel   Lipid panel   Meds ordered this encounter  Medications   FLUoxetine (PROZAC) 20 MG capsule    Sig: Take 1 capsule (20 mg total) by mouth daily.    Dispense:  90 capsule    Refill:  3      Body mass index is 25.99 kg/m. Wt Readings from Last 3 Encounters:  09/01/22 161 lb (73 kg)  06/19/21 177 lb 9.6 oz (80.6 kg)  02/06/21 174 lb 9.6 oz (79.2 kg)     Patient Active Problem List   Diagnosis Date Noted   Chronic sinus bradycardia 09/01/2022   Family history of breast cancer in sister 09/01/2022   Colon polyp 04/25/2020    last aug 2021: w/o polyp. q 5 year recall. Dr. Collene Mares    Family history of liver disease 02/16/2014    Mom died from non-alcoholic cirrhosis    Menopausal vasomotor syndrome 12/20/2013    Responsive to prozac    Vaginal atrophy 12/20/2013   Mixed hyperlipidemia 11/13/2010  Health Maintenance  Topic Date Due   DTaP/Tdap/Td (5 - Td or Tdap) 12/23/2021   PAP SMEAR-Modifier  12/31/2021   COVID-19 Vaccine (3 - Pfizer risk series) 09/17/2022 (Originally 12/30/2019)   MAMMOGRAM  04/25/2023   COLONOSCOPY (Pts 45-1yr Insurance coverage will need to be confirmed)  04/19/2025   INFLUENZA VACCINE  Completed   Hepatitis C Screening  Completed   HIV Screening  Completed   Zoster Vaccines- Shingrix  Completed   HPV VACCINES  Aged Out    Immunization History  Administered Date(s) Administered   Fluad Quad(high Dose 65+) 06/19/2021   Influenza Split 08/22/2022   Influenza, Quadrivalent, Recombinant, Inj, Pf 06/18/2018   Influenza, Seasonal, Injecte, Preservative Fre 07/16/2015, 08/29/2016   Influenza-Unspecified 06/18/2018, 06/24/2019, 08/05/2020   MMR 09/15/1987   PFIZER(Purple Top)SARS-COV-2 Vaccination 11/10/2019, 12/02/2019   PPD Test 09/15/1987   Td 06/06/2002, 09/14/2009   Tdap 09/14/2009, 12/24/2011   Zoster Recombinat (Shingrix) 06/19/2021, 09/02/2021   We updated and reviewed the patient's past history in detail and it is documented below. Allergies: Patient has No Known Allergies. Past Medical History Patient  has a past medical history of Colon polyp (04/20/2014), Hyperlipemia, and Internal hemorrhoids without complication. Past Surgical History Patient  has a past surgical history that includes Tonsillectomy. Family History: Patient family history includes Brain cancer in her maternal grandfather; Breast cancer in her sister; COPD in her father; Cirrhosis in her mother; Diabetes in her father; Heart disease in her maternal grandfather; Hyperlipidemia in her father. Social History:  Patient  reports that she has never smoked. She has never used smokeless tobacco. She reports that she does not currently use alcohol. She reports that she does not use drugs.  Review of Systems: Constitutional: negative for fever or malaise Ophthalmic: negative for photophobia, double vision or loss of vision Cardiovascular: negative for chest pain, dyspnea on exertion, or new LE swelling Respiratory: negative for SOB or persistent cough Gastrointestinal: negative for abdominal pain, change in bowel habits or melena Genitourinary: negative for dysuria or gross hematuria, no abnormal uterine bleeding or disharge Musculoskeletal: negative for new gait disturbance or muscular weakness Integumentary: negative for new or  persistent rashes, no breast lumps Neurological: negative for TIA or stroke symptoms Psychiatric: negative for SI or delusions Allergic/Immunologic: negative for hives  Patient Care Team    Relationship Specialty Notifications Start End  ALeamon Arnt MD PCP - General Family Medicine  12/14/16   PChristain Sacramento OSaritaReferring Physician Optometry  01/03/18   WJerelene Redden DDS     01/03/18   MJuanita Craver MD Consulting Physician Gastroenterology  04/25/20   JMartinique Amy, MD Consulting Physician Dermatology  04/25/20     Objective  Vitals: BP 110/80   Pulse (!) 52   Temp 97.7 F (36.5 C)   Ht _0  (1.676 m)   Wt 161 lb (73 kg)   LMP 09/27/2012   SpO2 97%   BMI 25.99 kg/m  General:  Well developed, well nourished, no acute distress  Psych:  Alert and orientedx3,normal mood and affect HEENT:  Normocephalic, atraumatic, non-icteric sclera,  supple neck without adenopathy, mass or thyromegaly Cardiovascular:  Normal S1, S2, RRR without gallop, rub or murmur Respiratory:  Good breath sounds bilaterally, CTAB with normal respiratory effort Gastrointestinal: normal bowel sounds, soft, non-tender, no noted masses. No HSM MSK: no deformities, contusions. Joints are without erythema or swelling.  Skin:  Warm, no rashes or suspicious lesions noted Neurologic:    Mental status is normal. CN 2-11 are  normal. Gross motor and sensory exams are normal. Normal gait. No tremor Breast Exam: No mass, skin retraction or nipple discharge is appreciated in either breast. No axillary adenopathy. Fibrocystic changes are not noted Pelvic Exam: Normal external genitalia, no vulvar or vaginal lesions present. Vaginal atrophy with vaginal stenosis. Clear cervix w/o CMT. Bimanual exam reveals a nontender fundus w/o masses, nl size. No adnexal masses present. No inguinal adenopathy. A PAP smear was performed.   Commons side effects, risks, benefits, and alternatives for medications and treatment plan prescribed today  were discussed, and the patient expressed understanding of the given instructions. Patient is instructed to call or message via MyChart if he/she has any questions or concerns regarding our treatment plan. No barriers to understanding were identified. We discussed Red Flag symptoms and signs in detail. Patient expressed understanding regarding what to do in case of urgent or emergency type symptoms.  Medication list was reconciled, printed and provided to the patient in AVS. Patient instructions and summary information was reviewed with the patient as documented in the AVS. This note was prepared with assistance of Dragon voice recognition software. Occasional wrong-word or sound-a-like substitutions may have occurred due to the inherent limitations of voice recognition software

## 2022-09-01 NOTE — Patient Instructions (Addendum)
Please return in 12 months for your annual complete physical; please come fasting.   I will release your lab results to you on your MyChart account with further instructions. You may see the results before I do, but when I review them I will send you a message with my report or have my assistant call you if things need to be discussed. Please reply to my message with any questions. Thank you!   Today you were given your Tdap vaccination.   If you have any questions or concerns, please don't hesitate to send me a message via MyChart or call the office at [phone]. Thank you for visiting with [place] today! It's our pleasure caring for you.   Shoulder Exercises Ask your health care provider which exercises are safe for you. Do exercises exactly as told by your health care provider and adjust them as directed. It is normal to feel mild stretching, pulling, tightness, or discomfort as you do these exercises. Stop right away if you feel sudden pain or your pain gets worse. Do not begin these exercises until told by your health care provider. Stretching exercises External rotation and abduction This exercise is sometimes called corner stretch. The exercise rotates your arm outward (external rotation) and moves your arm out from your body (abduction). Stand in a doorway with one of your feet slightly in front of the other. This is called a staggered stance. If you cannot reach your forearms to the door frame, stand facing a corner of a room. Choose one of the following positions as told by your health care provider: Place your hands and forearms on the door frame above your head. Place your hands and forearms on the door frame at the height of your head. Place your hands on the door frame at the height of your elbows. Slowly move your weight onto your front foot until you feel a stretch across your chest and in the front of your shoulders. Keep your head and chest upright and keep your abdominal muscles  tight. Hold for __________ seconds. To release the stretch, shift your weight to your back foot. Repeat __________ times. Complete this exercise __________ times a day. Extension, standing  Stand and hold a broomstick, a cane, or a similar object behind your back. Your hands should be a little wider than shoulder-width apart. Your palms should face away from your back. Keeping your elbows straight and your shoulder muscles relaxed, move the stick away from your body until you feel a stretch in your shoulders (extension). Avoid shrugging your shoulders while you move the stick. Keep your shoulder blades tucked down toward the middle of your back. Hold for __________ seconds. Slowly return to the starting position. Repeat __________ times. Complete this exercise __________ times a day. Range-of-motion exercises Pendulum  Stand near a wall or a surface that you can hold onto for balance. Bend at the waist and let your left / right arm hang straight down. Use your other arm to support you. Keep your back straight and do not lock your knees. Relax your left / right arm and shoulder muscles, and move your hips and your trunk so your left / right arm swings freely. Your arm should swing because of the motion of your body, not because you are using your arm or shoulder muscles. Keep moving your hips and trunk so your arm swings in the following directions, as told by your health care provider: Side to side. Forward and backward. In clockwise and counterclockwise  circles. Continue each motion for __________ seconds, or for as long as told by your health care provider. Slowly return to the starting position. Repeat __________ times. Complete this exercise __________ times a day. Shoulder flexion, standing  Stand and hold a broomstick, a cane, or a similar object. Place your hands a little more than shoulder-width apart on the object. Your left / right hand should be palm-up, and your other hand  should be palm-down. Keep your elbow straight and your shoulder muscles relaxed. Push the stick up with your healthy arm to raise your left / right arm in front of your body, and then over your head until you feel a stretch in your shoulder (flexion). Avoid shrugging your shoulder while you raise your arm. Keep your shoulder blade tucked down toward the middle of your back. Hold for __________ seconds. Slowly return to the starting position. Repeat __________ times. Complete this exercise __________ times a day. Shoulder abduction, standing  Stand and hold a broomstick, a cane, or a similar object. Place your hands a little more than shoulder-width apart on the object. Your left / right hand should be palm-up, and your other hand should be palm-down. Keep your elbow straight and your shoulder muscles relaxed. Push the object across your body toward your left / right side. Raise your left / right arm to the side of your body (abduction) until you feel a stretch in your shoulder. Do not raise your arm above shoulder height unless your health care provider tells you to do that. If directed, raise your arm over your head. Avoid shrugging your shoulder while you raise your arm. Keep your shoulder blade tucked down toward the middle of your back. Hold for __________ seconds. Slowly return to the starting position. Repeat __________ times. Complete this exercise __________ times a day. Internal rotation  Place your left / right hand behind your back, palm-up. Use your other hand to dangle an exercise band, a broomstick, or a similar object over your shoulder. Grasp the band with your left / right hand so you are holding on to both ends. Gently pull up on the band until you feel a stretch in the front of your left / right shoulder. The movement of your arm toward the center of your body is called internal rotation. Avoid shrugging your shoulder while you raise your arm. Keep your shoulder blade tucked  down toward the middle of your back. Hold for __________ seconds. Release the stretch by letting go of the band and lowering your hands. Repeat __________ times. Complete this exercise __________ times a day. Strengthening exercises External rotation  Sit in a stable chair without armrests. Secure an exercise band to a stable object at elbow height on your left / right side. Place a soft object, such as a folded towel or a small pillow, between your left / right upper arm and your body to move your elbow about 4 inches (10 cm) away from your side. Hold the end of the exercise band so it is tight and there is no slack. Keeping your elbow pressed against the soft object, slowly move your forearm out, away from your abdomen (external rotation). Keep your body steady so only your forearm moves. Hold for __________ seconds. Slowly return to the starting position. Repeat __________ times. Complete this exercise __________ times a day. Shoulder abduction  Sit in a stable chair without armrests, or stand up. Hold a __________ lb / kg weight in your left / right hand, or  hold an exercise band with both hands. Start with your arms straight down and your left / right palm facing in, toward your body. Slowly lift your left / right hand out to your side (abduction). Do not lift your hand above shoulder height unless your health care provider tells you that this is safe. Keep your arms straight. Avoid shrugging your shoulder while you do this movement. Keep your shoulder blade tucked down toward the middle of your back. Hold for __________ seconds. Slowly lower your arm, and return to the starting position. Repeat __________ times. Complete this exercise __________ times a day. Shoulder extension  Sit in a stable chair without armrests, or stand up. Secure an exercise band to a stable object in front of you so it is at shoulder height. Hold one end of the exercise band in each hand. Straighten your  elbows and lift your hands up to shoulder height. Squeeze your shoulder blades together as you pull your hands down to the sides of your thighs (extension). Stop when your hands are straight down by your sides. Do not let your hands go behind your body. Hold for __________ seconds. Slowly return to the starting position. Repeat __________ times. Complete this exercise __________ times a day. Shoulder row  Sit in a stable chair without armrests, or stand up. Secure an exercise band to a stable object in front of you so it is at chest height. Hold one end of the exercise band in each hand. Position your palms so that your thumbs are facing the ceiling (neutral position). Bend each of your elbows to a 90-degree angle (right angle) and keep your upper arms at your sides. Step back or move the chair back until the band is tight and there is no slack. Slowly pull your elbows back behind you. Hold for __________ seconds. Slowly return to the starting position. Repeat __________ times. Complete this exercise __________ times a day. Shoulder press-ups  Sit in a stable chair that has armrests. Sit upright, with your feet flat on the floor. Put your hands on the armrests so your elbows are bent and your fingers are pointing forward. Your hands should be about even with the sides of your body. Push down on the armrests and use your arms to lift yourself off the chair. Straighten your elbows and lift yourself up as much as you comfortably can. Move your shoulder blades down, and avoid letting your shoulders move up toward your ears. Keep your feet on the ground. As you get stronger, your feet should support less of your body weight as you lift yourself up. Hold for __________ seconds. Slowly lower yourself back into the chair. Repeat __________ times. Complete this exercise __________ times a day. Wall push-ups  Stand so you are facing a stable wall. Your feet should be about one arm-length away from  the wall. Lean forward and place your palms on the wall at shoulder height. Keep your feet flat on the floor as you bend your elbows and lean forward toward the wall. Hold for __________ seconds. Straighten your elbows to push yourself back to the starting position. Repeat __________ times. Complete this exercise __________ times a day. This information is not intended to replace advice given to you by your health care provider. Make sure you discuss any questions you have with your health care provider. Document Revised: 10/21/2021 Document Reviewed: 10/21/2021 Elsevier Patient Education  [place].

## 2022-09-02 ENCOUNTER — Other Ambulatory Visit: Payer: Self-pay

## 2022-09-02 ENCOUNTER — Other Ambulatory Visit (INDEPENDENT_AMBULATORY_CARE_PROVIDER_SITE_OTHER): Payer: BC Managed Care – PPO

## 2022-09-02 DIAGNOSIS — E875 Hyperkalemia: Secondary | ICD-10-CM | POA: Diagnosis not present

## 2022-09-03 LAB — BASIC METABOLIC PANEL
BUN: 17 mg/dL (ref 6–23)
CO2: 30 mEq/L (ref 19–32)
Calcium: 9.4 mg/dL (ref 8.4–10.5)
Chloride: 102 mEq/L (ref 96–112)
Creatinine, Ser: 0.91 mg/dL (ref 0.40–1.20)
GFR: 68.13 mL/min (ref >60.00)
Glucose, Bld: 94 mg/dL (ref 70–99)
Potassium: 5.2 mEq/L — ABNORMAL HIGH (ref 3.5–5.1)
Sodium: 140 mEq/L (ref 135–145)

## 2022-09-03 LAB — CYTOLOGY - PAP
Comment: NEGATIVE
Diagnosis: NEGATIVE
High risk HPV: NEGATIVE

## 2022-09-04 NOTE — Progress Notes (Signed)
Pcp aware of requirements for test and has declined test at this time

## 2023-02-21 IMAGING — MG MM DIGITAL SCREENING BILAT W/ TOMO AND CAD
6 of 10 series · 6 of 30 positions shown · non-contrast
Comparison: Previous exam(s).

CLINICAL DATA: Screening.

EXAM:
DIGITAL SCREENING BILATERAL MAMMOGRAM WITH TOMOSYNTHESIS AND CAD
TECHNIQUE: Bilateral screening digital craniocaudal and mediolateral oblique
mammograms were obtained. Bilateral screening digital breast
tomosynthesis was performed. The images were evaluated with
computer-aided detection.

[L MLO synth-2D]
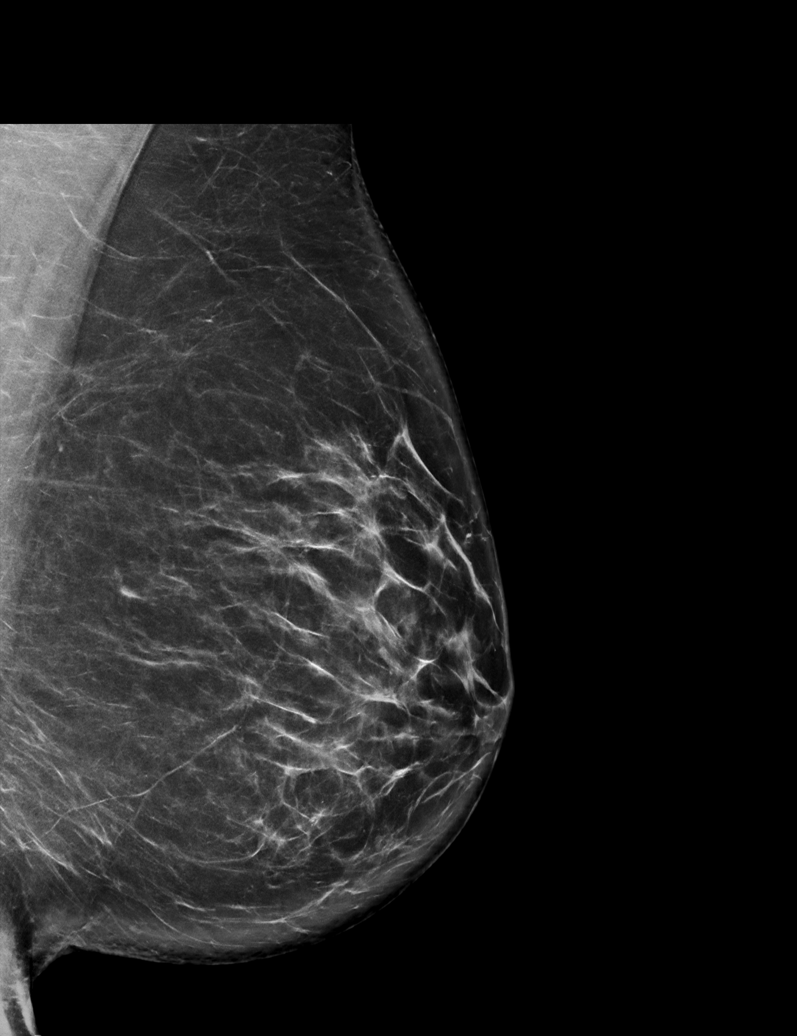

[R CC synth-2D (1 of 2)]
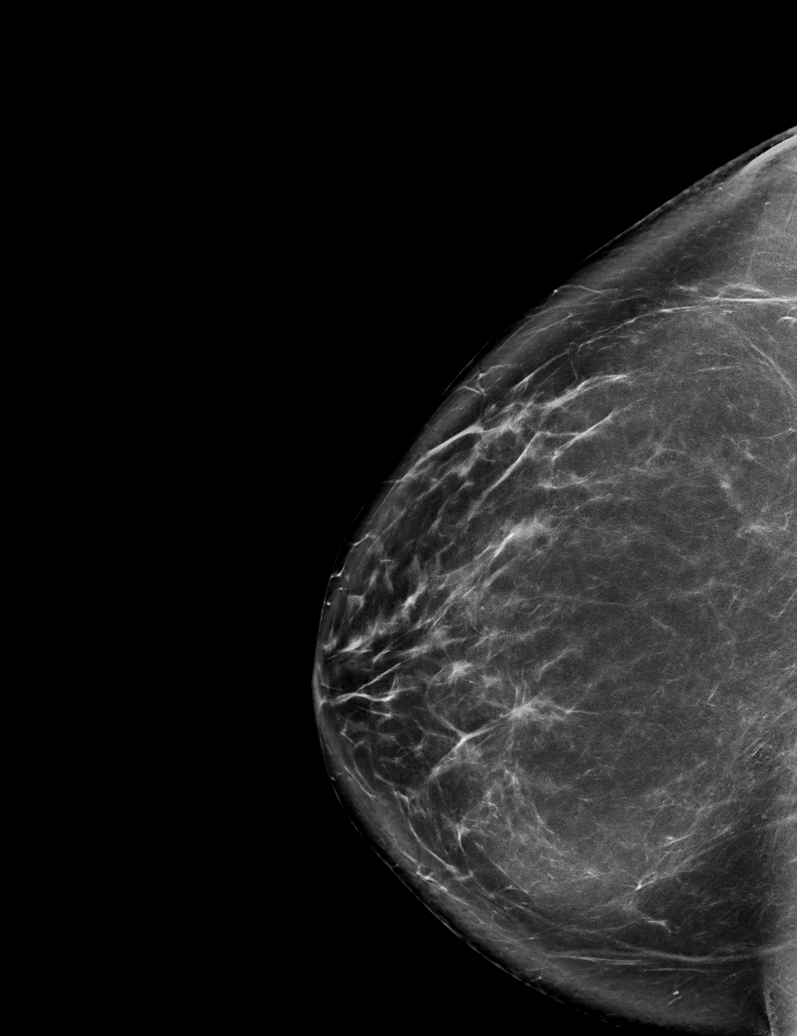

[L CC synth-2D]
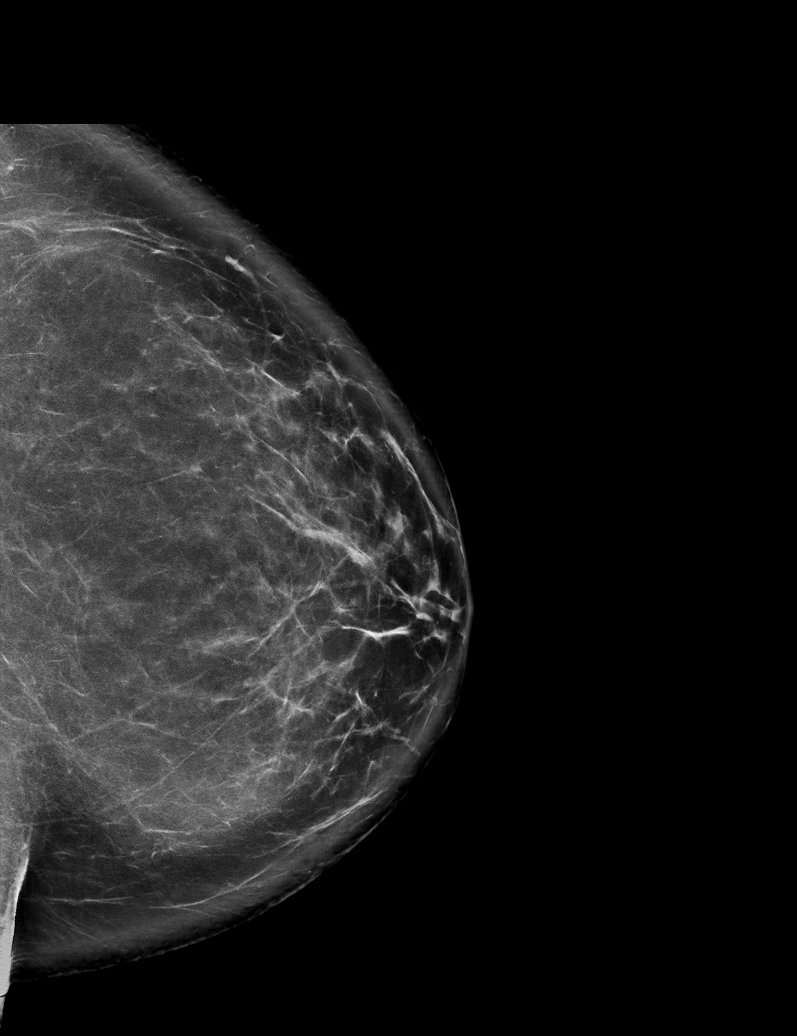

[R MLO synth-2D]
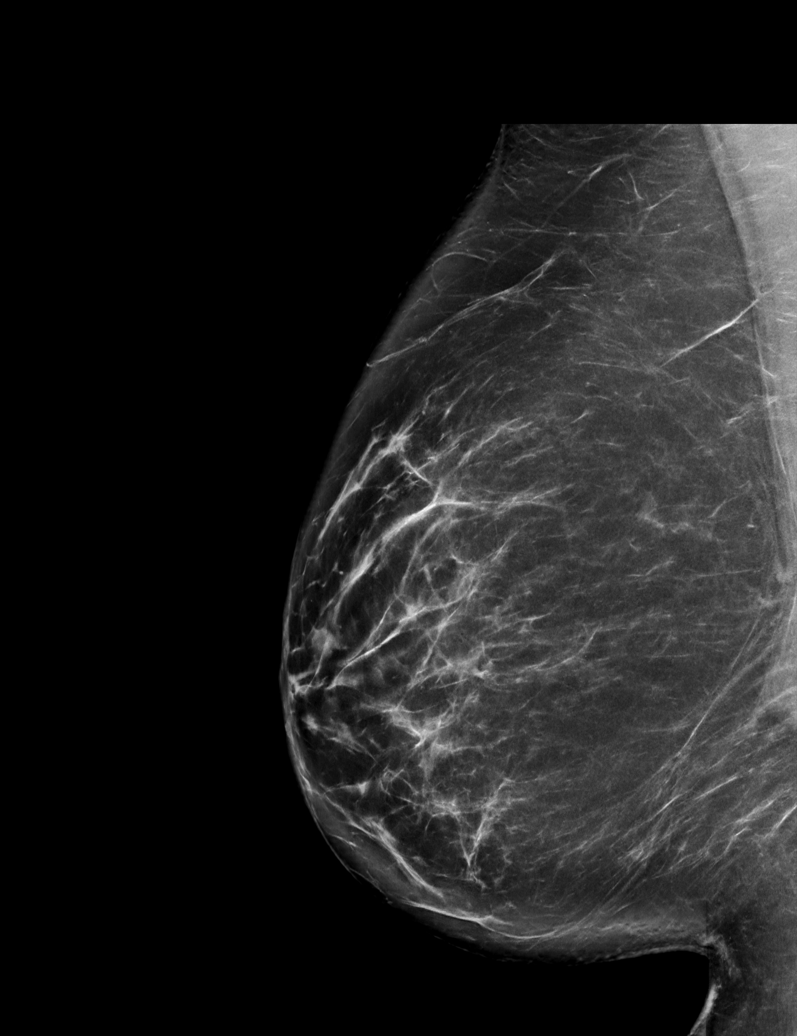

[R CC synth-2D (2 of 2)]
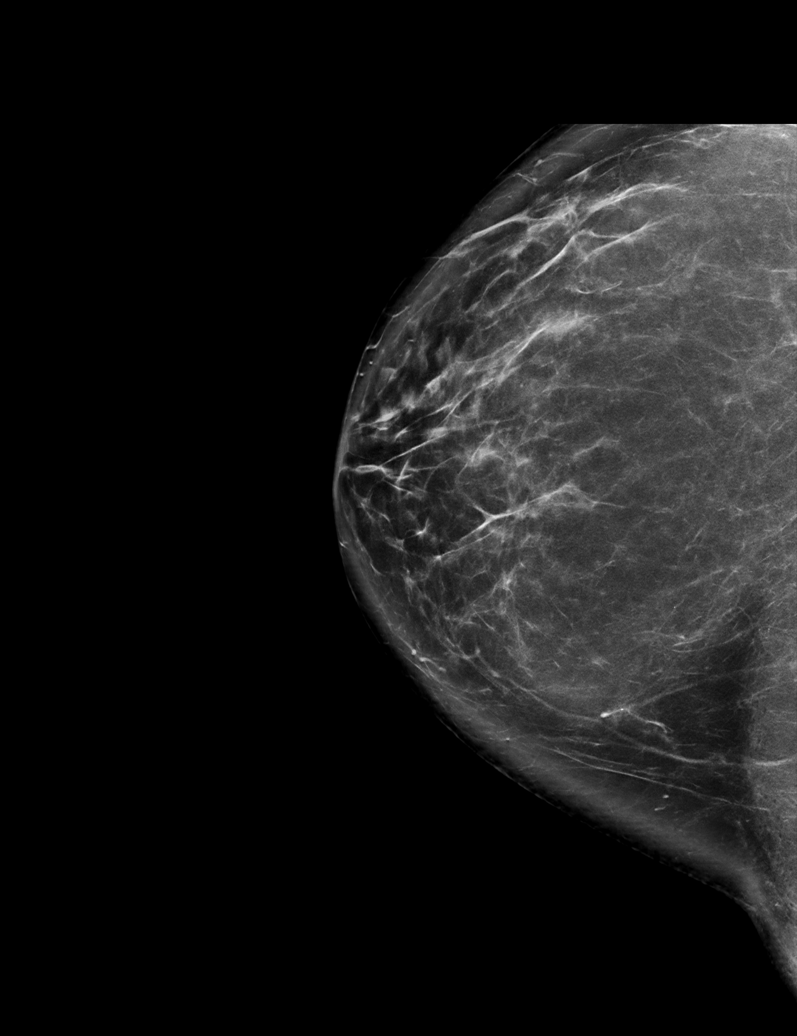

[R CC tomo · tomo slice 45/88.0]
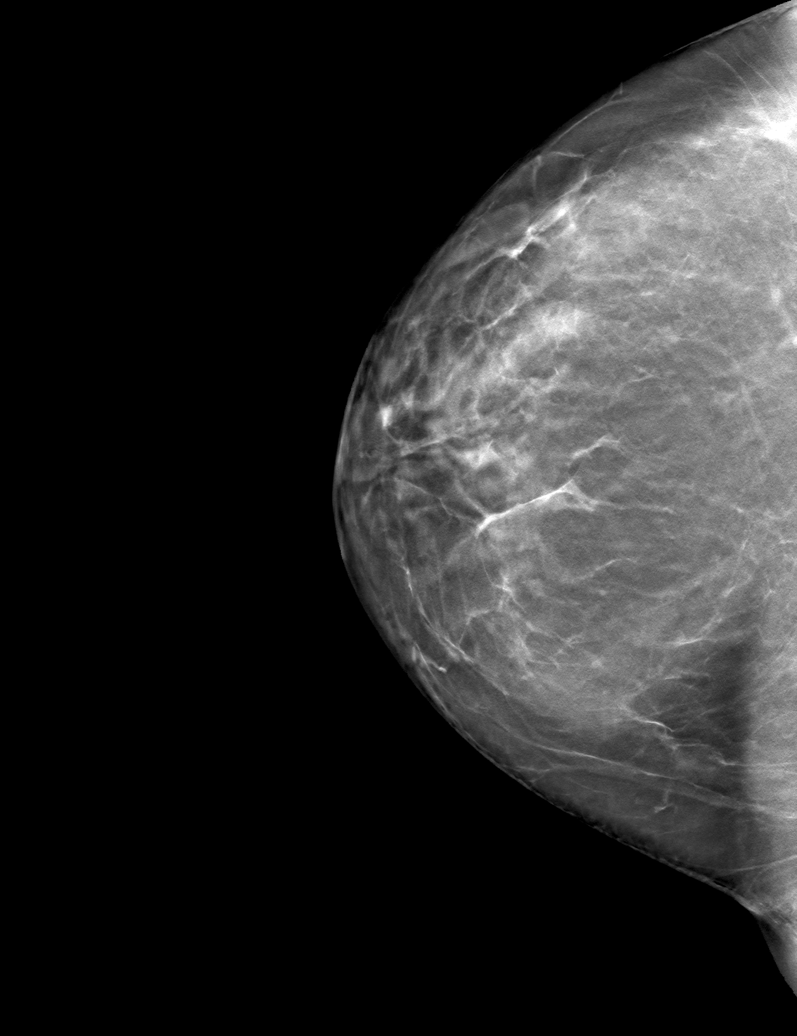

[6 of 30 positions shown; findings below may reference images not displayed]

ACR Breast Density Category b: There are scattered areas of
fibroglandular density.
FINDINGS: There are no findings suspicious for malignancy.
IMPRESSION: No mammographic evidence of malignancy. A result letter of this
screening mammogram will be mailed directly to the patient.

RECOMMENDATION:
Screening mammogram in one year. (Code:51-O-LD2)

BI-RADS CATEGORY  1: Negative.

## 2023-04-13 ENCOUNTER — Other Ambulatory Visit (HOSPITAL_BASED_OUTPATIENT_CLINIC_OR_DEPARTMENT_OTHER): Payer: Self-pay | Admitting: Family Medicine

## 2023-04-13 DIAGNOSIS — Z1231 Encounter for screening mammogram for malignant neoplasm of breast: Secondary | ICD-10-CM

## 2023-05-03 ENCOUNTER — Encounter (HOSPITAL_BASED_OUTPATIENT_CLINIC_OR_DEPARTMENT_OTHER): Payer: BC Managed Care – PPO | Admitting: Radiology

## 2023-05-03 DIAGNOSIS — Z1231 Encounter for screening mammogram for malignant neoplasm of breast: Secondary | ICD-10-CM

## 2023-05-13 ENCOUNTER — Ambulatory Visit (HOSPITAL_BASED_OUTPATIENT_CLINIC_OR_DEPARTMENT_OTHER)
Admission: RE | Admit: 2023-05-13 | Discharge: 2023-05-13 | Disposition: A | Discharge status: Home / Self Care | Payer: BC Managed Care – PPO | Source: Ambulatory Visit | Attending: Family Medicine | Admitting: Family Medicine

## 2023-05-13 DIAGNOSIS — Z1231 Encounter for screening mammogram for malignant neoplasm of breast: Secondary | ICD-10-CM | POA: Insufficient documentation

## 2023-07-26 ENCOUNTER — Other Ambulatory Visit: Payer: Self-pay | Admitting: Family Medicine

## 2023-08-30 ENCOUNTER — Other Ambulatory Visit: Payer: Self-pay | Admitting: Family Medicine

## 2023-09-06 ENCOUNTER — Encounter: Payer: BC Managed Care – PPO | Admitting: Family Medicine

## 2023-09-27 ENCOUNTER — Other Ambulatory Visit: Payer: Self-pay

## 2023-09-27 DIAGNOSIS — I83892 Varicose veins of left lower extremities with other complications: Secondary | ICD-10-CM

## 2023-09-29 ENCOUNTER — Encounter: Payer: Self-pay | Admitting: Family Medicine

## 2023-09-29 ENCOUNTER — Ambulatory Visit (INDEPENDENT_AMBULATORY_CARE_PROVIDER_SITE_OTHER): Payer: 59 | Admitting: Family Medicine

## 2023-09-29 VITALS — BP 126/84 | HR 63 | Temp 97.7°F | Ht 66.0 in | Wt 182.2 lb

## 2023-09-29 DIAGNOSIS — E875 Hyperkalemia: Secondary | ICD-10-CM

## 2023-09-29 DIAGNOSIS — F411 Generalized anxiety disorder: Secondary | ICD-10-CM | POA: Insufficient documentation

## 2023-09-29 DIAGNOSIS — K635 Polyp of colon: Secondary | ICD-10-CM | POA: Diagnosis not present

## 2023-09-29 DIAGNOSIS — E782 Mixed hyperlipidemia: Secondary | ICD-10-CM | POA: Diagnosis not present

## 2023-09-29 DIAGNOSIS — Z0001 Encounter for general adult medical examination with abnormal findings: Secondary | ICD-10-CM | POA: Diagnosis not present

## 2023-09-29 DIAGNOSIS — R001 Bradycardia, unspecified: Secondary | ICD-10-CM

## 2023-09-29 LAB — COMPREHENSIVE METABOLIC PANEL
ALT: 15 U/L (ref 0–35)
AST: 24 U/L (ref 0–37)
Albumin: 4.5 g/dL (ref 3.5–5.2)
Alkaline Phosphatase: 66 U/L (ref 39–117)
BUN: 16 mg/dL (ref 6–23)
CO2: 28 meq/L (ref 19–32)
Calcium: 9.3 mg/dL (ref 8.4–10.5)
Chloride: 100 meq/L (ref 96–112)
Creatinine, Ser: 0.77 mg/dL (ref 0.40–1.20)
GFR: 82.63 mL/min (ref >60.00)
Glucose, Bld: 81 mg/dL (ref 70–99)
Potassium: 5 meq/L (ref 3.5–5.1)
Sodium: 134 meq/L — ABNORMAL LOW (ref 135–145)
Total Bilirubin: 0.5 mg/dL (ref 0.2–1.2)
Total Protein: 7.3 g/dL (ref 6.0–8.3)

## 2023-09-29 LAB — CBC WITH DIFFERENTIAL/PLATELET
Basophils Absolute: 0 10*3/uL (ref 0.0–0.1)
Basophils Relative: 0.7 % (ref 0.0–3.0)
Eosinophils Absolute: 0.1 10*3/uL (ref 0.0–0.7)
Eosinophils Relative: 2.2 % (ref 0.0–5.0)
HCT: 42.3 % (ref 36.0–46.0)
Hemoglobin: 14 g/dL (ref 12.0–15.0)
Lymphocytes Relative: 34.4 % (ref 12.0–46.0)
Lymphs Abs: 2 10*3/uL (ref 0.7–4.0)
MCHC: 33.1 g/dL (ref 30.0–36.0)
MCV: 88.3 fL (ref 78.0–100.0)
Monocytes Absolute: 0.6 10*3/uL (ref 0.1–1.0)
Monocytes Relative: 10.1 % (ref 3.0–12.0)
Neutro Abs: 3 10*3/uL (ref 1.4–7.7)
Neutrophils Relative %: 52.6 % (ref 43.0–77.0)
Platelets: 263 10*3/uL (ref 150.0–400.0)
RBC: 4.79 Mil/uL (ref 3.87–5.11)
RDW: 13.6 % (ref 11.5–15.5)
WBC: 5.7 10*3/uL (ref 4.0–10.5)

## 2023-09-29 LAB — LIPID PANEL
Cholesterol: 225 mg/dL — ABNORMAL HIGH (ref 0–200)
HDL: 61.9 mg/dL (ref >39.00)
LDL Cholesterol: 149 mg/dL — ABNORMAL HIGH (ref 0–99)
NonHDL: 163.59
Total CHOL/HDL Ratio: 4
Triglycerides: 74 mg/dL (ref 0.0–149.0)
VLDL: 14.8 mg/dL (ref 0.0–40.0)

## 2023-09-29 LAB — TSH: TSH: 1.83 u[IU]/mL (ref 0.35–5.50)

## 2023-09-29 LAB — CORTISOL: Cortisol, Plasma: 14.4 ug/dL

## 2023-09-29 NOTE — Patient Instructions (Addendum)
 Please return in 12 months for your annual complete physical; please come fasting.   I will release your lab results to you on your MyChart account with further instructions. You may see the results before I do, but when I review them I will send you a message with my report or have my assistant call you if things need to be discussed. Please reply to my message with any questions. Thank you!   If you have any questions or concerns, please don't hesitate to send me a message via MyChart or call the office at [phone]. Thank you for visiting with us  today! It's our pleasure caring for you.   VISIT SUMMARY:  You came in today for a routine physical examination. You reported no new health concerns and are doing well on your current medications for cholesterol and anxiety. We discussed your longstanding high-normal potassium levels and your concerns about potential adrenal gland issues. Overall, you are in good health and satisfied with your current state of health.  YOUR PLAN:  -HYPERKALEMIA: Hyperkalemia means having higher than normal levels of potassium in your blood. We need to do more blood tests to check your potassium levels and adrenal function. If the results are abnormal, we may refer you to an endocrinologist, a specialist who deals with hormone-related issues.  -ANXIETY: Anxiety is a condition that causes excessive worry or fear. You have been managing your anxiety well with Prozac , which you initially started for menopausal symptoms. You should continue taking Prozac  as prescribed.  -GENERAL HEALTH MAINTENANCE: You are 63 years old and have not had a bone density scan since moving to our practice. Routine bone density screening is recommended at age 45 unless there are high-risk factors. We will schedule a bone density scan when you turn 65.  INSTRUCTIONS:  We will review your lab results to determine if you need a referral to an endocrinologist. Please schedule your next routine  visit.

## 2023-09-29 NOTE — Progress Notes (Signed)
 Subjective  Chief Complaint  Patient presents with   Annual Exam    Pt here for Annual Exam and is currently fasting     HPI: Alexandra Burton is a 63 y.o. female who presents to [facility] [place] today for a Female Wellness Visit. She also has the concerns and/or needs as listed above in the chief complaint. These will be addressed in addition to the Health Maintenance Visit.   Wellness Visit: annual visit with health maintenance review and exam  HM: mammo and pap are current. Doing well. No acute concerns. Imms and eye exam current. Chronic disease f/u and/or acute problem visit: (deemed necessary to be done in addition to the wellness visit): Discussed the use of AI scribe software for clinical note transcription with the patient, who gave verbal consent to proceed.  History of Present Illness   The patient, with a history of hyperlipidemia and anxiety, presents for a routine physical examination. She reports no new health concerns and is doing well on her current regimen of cholesterol medication and Prozac . The patient initially started Prozac  for menopausal symptoms but found it significantly helped with her underlying anxiety. She expresses a desire to continue the medication due to its beneficial effects on her anxiety symptoms. Reviewed history: endorses lifelong anxiety and large improvements in well being and less worry with prozac .  The patient also mentions a longstanding issue with high-normal potassium levels. Reviewed labs over last 5-6 years.   Reviewed colonoscopy: screens are current. H/o polyps       Assessment  1. Encounter for well adult exam with abnormal findings   2. Mixed hyperlipidemia   3. Polyp of colon, unspecified part of colon, unspecified type   4. Hyperkalemia   5. GAD (generalized anxiety disorder)   6. Chronic sinus bradycardia      Plan  Female Wellness Visit: Age appropriate Health Maintenance and Prevention  measures were discussed with patient. Included topics are cancer screening recommendations, ways to keep healthy (see AVS) including dietary and exercise recommendations, regular eye and dental care, use of seat belts, and avoidance of moderate alcohol use and tobacco use.  BMI: discussed patient's BMI and encouraged positive lifestyle modifications to help get to or maintain a target BMI. HM needs and immunizations were addressed and ordered. See below for orders. See HM and immunization section for updates. Routine labs and screening tests ordered including cmp, cbc and lipids where appropriate. Discussed recommendations regarding Vit D and calcium supplementation (see AVS)  Chronic disease management visit and/or acute problem visit: Assessment and Plan    Hyperkalemia Persistent high-normal potassium levels. Differential diagnosis includes hypoaldosteronism. Further testing required to determine if endocrinology referral is necessary.  - Order additional blood tests to evaluate potassium levels and adrenal function, aldosterone, cmp, acth , cortisol and PRA - Consider endocrinology referral if test results are abnormal  Anxiety, generalized Long-standing anxiety managed with Prozac , initially prescribed for menopausal symptoms. Significant improvement in anxiety symptoms with current medication. Continuation recommended. - Continue Prozac  as current regimen, 20 daily  Hyperlipidemia -check lipds on simvastatin 20 w/ lfts.   Chronic brady is stable and asymptomatic  Follow-up 12 mo for cpe      Orders Placed This Encounter  Procedures   CBC with Differential/Platelet   Comprehensive metabolic panel   Lipid panel   TSH   Aldosterone + renin activity w/ ratio   Cortisol   ACTH    No orders of the defined types were placed  in this encounter.     Body mass index is 29.41 kg/m. Wt Readings from Last 3 Encounters:  09/29/23 182 lb 3.2 oz (82.6 kg)  09/01/22 161 lb (73 kg)   06/19/21 177 lb 9.6 oz (80.6 kg)     Patient Active Problem List   Diagnosis Date Noted Date Diagnosed   GAD (generalized anxiety disorder) 09/29/2023     Now on chronic prozac  and does well. Started meds at onset of menopause but endorses life long anxiety that had been managed behaviorally.    Hyperkalemia 09/29/2023    Chronic sinus bradycardia 09/01/2022    Family history of breast cancer in sister 09/01/2022    Colon polyp 04/25/2020     last aug 2021: w/o polyp. q 5 year recall. Dr. Tova Fresh    Family history of liver disease 2014/02/19     Mom died from non-alcoholic cirrhosis    Menopausal vasomotor syndrome 12/20/2013     Responsive to prozac     Vaginal atrophy 12/20/2013    Mixed hyperlipidemia 11/13/2010    Health Maintenance  Topic Date Due   COVID-19 Vaccine (3 - 2024-25 season) 10/15/2023 (Originally 05/16/2023)   MAMMOGRAM  05/12/2024   Colonoscopy  04/19/2025   Cervical Cancer Screening (HPV/Pap Cotest)  09/02/2027   DTaP/Tdap/Td (6 - Td or Tdap) 09/01/2032   INFLUENZA VACCINE  Completed   Hepatitis C Screening  Completed   HIV Screening  Completed   Zoster Vaccines- Shingrix  Completed   HPV VACCINES  Aged Out   Immunization History  Administered Date(s) Administered   Fluad Quad(high Dose 65+) 06/19/2021   Influenza Split 08/22/2022   Influenza, Quadrivalent, Recombinant, Inj, Pf 06/18/2018   Influenza, Seasonal, Injecte, Preservative Fre 07/16/2015, 08/29/2016   Influenza-Unspecified 06/18/2018, 06/24/2019, 08/05/2020   MMR 09/15/1987   PFIZER(Purple Top)SARS-COV-2 Vaccination 11/10/2019, 12/02/2019   PPD Test 09/15/1987   Td 06/06/2002, 09/14/2009   Tdap 09/14/2009, 12/24/2011, 09/01/2022   Zoster Recombinant(Shingrix) 06/19/2021, 09/02/2021   We updated and reviewed the patient's past history in detail and it is documented below. Allergies: Patient has no known allergies. Past Medical History Patient  has a past medical history of Colon polyp  (04/20/2014), Hyperlipemia, and Internal hemorrhoids without complication. Past Surgical History Patient  has a past surgical history that includes Tonsillectomy. Family History: Patient family history includes Brain cancer in her maternal grandfather; Breast cancer in her sister; COPD in her father; Cirrhosis in her mother; Diabetes in her father; Heart disease in her maternal grandfather; Hyperlipidemia in her father. Social History:  Patient  reports that she has never smoked. She has never used smokeless tobacco. She reports that she does not currently use alcohol. She reports that she does not use drugs.  Review of Systems: Constitutional: negative for fever or malaise Ophthalmic: negative for photophobia, double vision or loss of vision Cardiovascular: negative for chest pain, dyspnea on exertion, or new LE swelling Respiratory: negative for SOB or persistent cough Gastrointestinal: negative for abdominal pain, change in bowel habits or melena Genitourinary: negative for dysuria or gross hematuria, no abnormal uterine bleeding or disharge Musculoskeletal: negative for new gait disturbance or muscular weakness Integumentary: negative for new or persistent rashes, no breast lumps Neurological: negative for TIA or stroke symptoms Psychiatric: negative for SI or delusions Allergic/Immunologic: negative for hives  Patient Care Team    Relationship Specialty Notifications Start End  Luevenia Saha, MD PCP - General Family Medicine  12/14/16   Sandor Crosser, OD Referring Physician Optometry  01/03/18  Carola Chu, DDS     01/03/18   Tami Falcon, MD Consulting Physician Gastroenterology  04/25/20   [place], Amy, MD Consulting Physician Dermatology  04/25/20     Objective  Vitals: BP 126/84   Pulse 63   Temp 97.7 F (36.5 C)   Ht 5\' 6"  (1.676 m)   Wt 182 lb 3.2 oz (82.6 kg)   LMP 09/27/2012   SpO2 98%   BMI 29.41 kg/m  General:  Well developed, well nourished, no acute distress   Psych:  Alert and orientedx3,normal mood and affect HEENT:  Normocephalic, atraumatic, non-icteric sclera,  supple neck without adenopathy, mass or thyromegaly Cardiovascular:  Normal S1, S2, RRR without gallop, rub or murmur Respiratory:  Good breath sounds bilaterally, CTAB with normal respiratory effort Gastrointestinal: normal bowel sounds, soft, non-tender, no noted masses. No HSM MSK: extremities without edema, joints without erythema or swelling Neurologic:    Mental status is normal.  Gross motor and sensory exams are normal.  No tremor  Commons side effects, risks, benefits, and alternatives for medications and treatment plan prescribed today were discussed, and the patient expressed understanding of the given instructions. Patient is instructed to call or message via MyChart if he/she has any questions or concerns regarding our treatment plan. No barriers to understanding were identified. We discussed Red Flag symptoms and signs in detail. Patient expressed understanding regarding what to do in case of urgent or emergency type symptoms.  Medication list was reconciled, printed and provided to the patient in AVS. Patient instructions and summary information was reviewed with the patient as documented in the AVS. This note was prepared with assistance of Dragon voice recognition software. Occasional wrong-word or sound-a-like substitutions may have occurred due to the inherent limitations of voice recognition software

## 2023-10-11 LAB — ACTH: C206 ACTH: 36 pg/mL (ref 6–50)

## 2023-10-11 LAB — ALDOSTERONE + RENIN ACTIVITY W/ RATIO
ALDO / PRA Ratio: 8.5 {ratio} (ref 0.9–28.9)
Aldosterone: 10 ng/dL
Renin Activity: 1.17 ng/mL/h (ref 0.25–5.82)

## 2023-10-11 NOTE — Progress Notes (Signed)
Please find out if the aldosterone and renin activity test was run; it has not resulted

## 2023-10-18 ENCOUNTER — Encounter: Payer: Self-pay | Admitting: Family Medicine

## 2023-10-18 ENCOUNTER — Other Ambulatory Visit: Payer: Self-pay

## 2023-10-18 DIAGNOSIS — I83892 Varicose veins of left lower extremities with other complications: Secondary | ICD-10-CM

## 2023-10-18 NOTE — Progress Notes (Signed)
Labs reviewed. Nl aldo and renin activity and corisol levels.  Borderline high potassium and borderline low sodium. Monitoring.  The 10-year ASCVD risk score (Arnett DK, et al., 2019) is: 4%   Values used to calculate the score:     Age: 63 years     Sex: Female     Is Non-Hispanic African American: No     Diabetic: No     Tobacco smoker: No     Systolic Blood Pressure: 126 mmHg     Is BP treated: No     HDL Cholesterol: 61.9 mg/dL     Total Cholesterol: 225 mg/dL

## 2023-11-01 ENCOUNTER — Ambulatory Visit: Payer: 59 | Admitting: Physician Assistant

## 2023-11-01 ENCOUNTER — Ambulatory Visit (HOSPITAL_COMMUNITY)
Admission: RE | Admit: 2023-11-01 | Discharge: 2023-11-01 | Disposition: A | Discharge status: Home / Self Care | Payer: 59 | Source: Ambulatory Visit | Attending: Surgery | Admitting: Surgery

## 2023-11-01 VITALS — BP 118/77 | HR 56 | Temp 98.3°F | Ht 66.0 in | Wt 175.4 lb

## 2023-11-01 DIAGNOSIS — I83892 Varicose veins of left lower extremities with other complications: Secondary | ICD-10-CM

## 2023-11-01 DIAGNOSIS — I872 Venous insufficiency (chronic) (peripheral): Secondary | ICD-10-CM | POA: Diagnosis not present

## 2023-11-01 NOTE — Progress Notes (Signed)
Office Note     CC:  follow up Requesting Provider:  [name], MD  HPI: Alexandra Burton is a 63 y.o. ([birth year]) female who presents for evaluation of varicose veins of bilateral lower extremities left worse than right.  She also experiences achiness, heavy, fatigue symptoms in her left leg more than the right.  She was seen in our office over 10 years ago and underwent sclerotherapy by sclerotherapy nurse Alexandra Burton.  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  She does not wear compression or make an effort to elevate her legs after work.  She denies history of DVT, venous ulcerations, trauma, or prior vascular interventions.  She does not smoke.   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     Employer: [employer]  Tobacco Use   Smoking status: Never   Smokeless tobacco: Never  Vaping Use   Vaping status: Never Used  Substance and Sexual Activity   Alcohol use: Not Currently   Drug use: Never   Sexual activity: Yes  Other Topics Concern   Not on file  Social History Narrative   Not on file   Social Drivers of Health   Financial Resource Strain: Not on file  Food Insecurity: Not on file  Transportation Needs: Not on file  Physical Activity: Not on file  Stress: Not on file  Social Connections: Not on file  Intimate Partner Violence: Not on file    Family History  Problem Relation Age of Onset   Cirrhosis Mother        NASH   COPD Father    Diabetes  Father    Hyperlipidemia Father    Breast cancer Sister        Premenopausal   Heart disease Maternal Grandfather    Brain cancer Maternal Grandfather     Current Outpatient Medications  Medication Sig Dispense Refill   FLUoxetine (PROZAC) 20 MG capsule TAKE 1 CAPSULE BY MOUTH DAILY 90 capsule 3   lovastatin (MEVACOR) 20 MG tablet TAKE ONE TABLET BY MOUTH EVERY NIGHT AT BEDTIME 90 tablet 3   No current facility-administered medications for this visit.    No Known Allergies   REVIEW OF SYSTEMS:   [X]  denotes positive finding, [ ]  denotes negative finding Cardiac  Comments:  Chest pain or chest pressure:    Shortness of breath upon exertion:    Short of breath when lying flat:    Irregular heart rhythm:        Vascular    Pain in calf, thigh, or hip brought on by ambulation:    Pain in feet at night that wakes you up from your sleep:     Blood clot in your veins:    Leg swelling:         Pulmonary    Oxygen at home:    Productive cough:     Wheezing:  Neurologic    Sudden weakness in arms or legs:     Sudden numbness in arms or legs:     Sudden onset of difficulty speaking or slurred speech:    Temporary loss of vision in one eye:     Problems with dizziness:         Gastrointestinal    Blood in stool:     Vomited blood:         Genitourinary    Burning when urinating:     Blood in urine:        Psychiatric    Major depression:         Hematologic    Bleeding problems:    Problems with blood clotting too easily:        Skin    Rashes or ulcers:        Constitutional    Fever or chills:      PHYSICAL EXAMINATION:  Vitals:   11/01/23 1333  BP: 118/77  Pulse: (!) 56  Temp: 98.3 F (36.8 C)  SpO2: 99%  Weight: 175 lb 6.4 oz (79.6 kg)  Height: 5\' 6"  (1.676 m)    General:  WDWN in NAD; vital signs documented above Gait: Not observed HENT: WNL, normocephalic Pulmonary: normal non-labored breathing , without Rales, rhonchi,   wheezing Cardiac: regular HR Abdomen: soft, NT, no masses Skin: without rashes Vascular Exam/Pulses: palpable DP pulses Extremities: No venous ulcerations or pitting edema; varicosities pictured below Musculoskeletal: no muscle wasting or atrophy  Neurologic: A&O X 3 Psychiatric:  The pt has Normal affect.    Non-Invasive Vascular Imaging:   Left lower extremity venous reflux study was negative for DVT Incompetent common femoral vein Incompetent GSV at the saphenofemoral junction and distal thigh only with diameter less than 4 mm and most of the thigh    ASSESSMENT/PLAN:: 63 y.o. female here for evaluation of varicose veins of bilateral lower extremities left worse than right  Ms. Alexandra Burton is a 63 year old female with venous insufficiency and varicose veins.  She has venous symptoms in the left more so than the right leg.  Left lower extremity venous reflux study was negative for DVT.  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  The saphenous vein however is less than 4 mm in most of the thigh thus she is not a great candidate for laser ablation therapy.  She was evaluated by Alexandra Burton today for possible sclerotherapy however varicose veins may be too large to treat with sclerotherapy.  Case will be discussed with Alexandra Burton or Alexandra Burton to see if she will be a candidate for stab phlebectomy.  She was measured for and prescribed thigh-high compression to be worn on a regular basis.  We also recommended periodic leg elevation and to avoid prolonged sitting and standing when possible during the day.  She can use NSAIDs for discomfort associated with her veins.  For now she will follow-up on an as-needed basis.   Alexandra Rutter, PA-C [facility] [phone]  Clinic MD:   Chestine Spore on call

## 2024-05-11 ENCOUNTER — Other Ambulatory Visit: Payer: Self-pay | Admitting: Family Medicine

## 2024-05-11 DIAGNOSIS — Z1231 Encounter for screening mammogram for malignant neoplasm of breast: Secondary | ICD-10-CM

## 2024-05-20 ENCOUNTER — Encounter (HOSPITAL_BASED_OUTPATIENT_CLINIC_OR_DEPARTMENT_OTHER): Payer: Self-pay | Admitting: Radiology

## 2024-05-20 ENCOUNTER — Ambulatory Visit (HOSPITAL_BASED_OUTPATIENT_CLINIC_OR_DEPARTMENT_OTHER)
Admission: RE | Admit: 2024-05-20 | Discharge: 2024-05-20 | Disposition: A | Discharge status: Home / Self Care | Source: Ambulatory Visit | Attending: Family Medicine | Admitting: Family Medicine

## 2024-05-20 DIAGNOSIS — Z1231 Encounter for screening mammogram for malignant neoplasm of breast: Secondary | ICD-10-CM | POA: Insufficient documentation

## 2024-05-22 ENCOUNTER — Encounter (HOSPITAL_BASED_OUTPATIENT_CLINIC_OR_DEPARTMENT_OTHER): Admitting: Radiology

## 2024-05-22 DIAGNOSIS — Z1231 Encounter for screening mammogram for malignant neoplasm of breast: Secondary | ICD-10-CM

## 2024-07-24 ENCOUNTER — Other Ambulatory Visit: Payer: Self-pay | Admitting: Family Medicine

## 2024-08-25 ENCOUNTER — Other Ambulatory Visit: Payer: Self-pay | Admitting: Family Medicine

## 2024-08-28 NOTE — Telephone Encounter (Signed)
 1111/15/2025 LOV  10/30/2022 fil date  90/3 refills

## 2024-09-11 ENCOUNTER — Telehealth: Payer: Self-pay | Admitting: Family Medicine

## 2024-09-11 NOTE — Telephone Encounter (Signed)
 LVM to R/S CPE on 10/03/24 with Jodie.

## 2024-10-03 ENCOUNTER — Encounter: Admitting: Family Medicine

## 2024-10-27 ENCOUNTER — Encounter: Admitting: Family Medicine

## 2024-11-06 ENCOUNTER — Encounter: Admitting: Family Medicine
